# Patient Record
Sex: Male | Born: 1960 | Race: White | Hispanic: No | Marital: Married | Smoking: Never smoker
Health system: Southern US, Community
[De-identification: ages and names within clinical notes are randomized; demographics above are authoritative.]

## PROBLEM LIST (undated history)

## (undated) DIAGNOSIS — R519 Headache, unspecified: Secondary | ICD-10-CM

## (undated) DIAGNOSIS — F32A Depression, unspecified: Secondary | ICD-10-CM

## (undated) DIAGNOSIS — Z87442 Personal history of urinary calculi: Secondary | ICD-10-CM

## (undated) DIAGNOSIS — H04129 Dry eye syndrome of unspecified lacrimal gland: Secondary | ICD-10-CM

## (undated) DIAGNOSIS — M797 Fibromyalgia: Secondary | ICD-10-CM

## (undated) DIAGNOSIS — F419 Anxiety disorder, unspecified: Secondary | ICD-10-CM

## (undated) DIAGNOSIS — M1990 Unspecified osteoarthritis, unspecified site: Secondary | ICD-10-CM

## (undated) DIAGNOSIS — G4730 Sleep apnea, unspecified: Secondary | ICD-10-CM

## (undated) DIAGNOSIS — K219 Gastro-esophageal reflux disease without esophagitis: Secondary | ICD-10-CM

## (undated) DIAGNOSIS — R7303 Prediabetes: Secondary | ICD-10-CM

## (undated) HISTORY — PX: INGUINAL HERNIA REPAIR: SUR1180

## (undated) HISTORY — PX: HAMMER TOE SURGERY: SHX385

## (undated) HISTORY — PX: COLONOSCOPY: SHX174

## (undated) HISTORY — PX: CATARACT EXTRACTION: SUR2

## (undated) HISTORY — PX: CHOLECYSTECTOMY: SHX55

## (undated) HISTORY — PX: TONSILLECTOMY: SUR1361

## (undated) HISTORY — PX: KNEE ARTHROSCOPY: SUR90

---

## 1999-09-28 ENCOUNTER — Encounter: Payer: Self-pay | Admitting: Emergency Medicine

## 1999-09-28 ENCOUNTER — Emergency Department (HOSPITAL_COMMUNITY): Admission: EM | Admit: 1999-09-28 | Discharge: 1999-09-28 | Payer: Self-pay | Admitting: Emergency Medicine

## 2005-04-09 ENCOUNTER — Ambulatory Visit: Payer: Self-pay | Admitting: Internal Medicine

## 2005-04-09 ENCOUNTER — Ambulatory Visit (HOSPITAL_BASED_OUTPATIENT_CLINIC_OR_DEPARTMENT_OTHER): Admission: RE | Admit: 2005-04-09 | Discharge: 2005-04-09 | Payer: Self-pay | Admitting: Family Medicine

## 2005-04-10 ENCOUNTER — Encounter: Admission: RE | Admit: 2005-04-10 | Discharge: 2005-04-10 | Payer: Self-pay | Admitting: Gastroenterology

## 2005-04-10 IMAGING — CT CT PELVIS W/ CM
1 of 3 series · 14 of 32 positions shown, 19 images · IV contrast (READICAT/WATER & [ID] OMNI 300)
Comparison: None.

CLINICAL DATA: 44-year-old, with nausea, diarrhea, and bloating.  
 ABDOMEN CT WITH CONTRAST:
TECHNIQUE: Multidetector CT imaging of the abdomen was performed following the standard protocol during bolus administration of intravenous contrast.
 Contrast:  100 cc Omnipaque 300
TECHNIQUE: Multidetector CT imaging of the pelvis was performed following the standard protocol during bolus administration of intravenous contrast.

[Series 2: routine abdomen · axial · 0.70mm/px · z∈[-384,-30]mm · 14 of 81 slices shown, 19 images]
[im 5/81  soft-tissue]
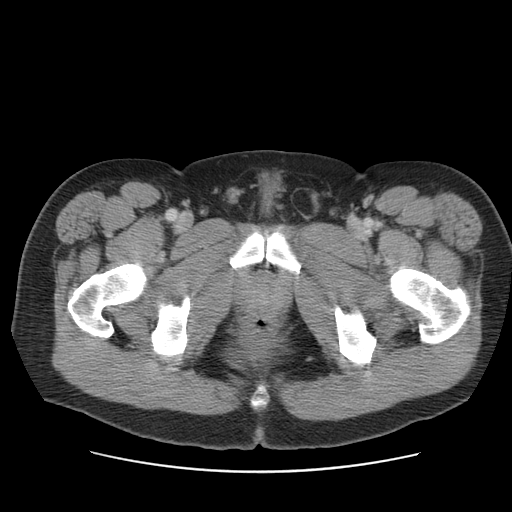
[im 5/81  bone]
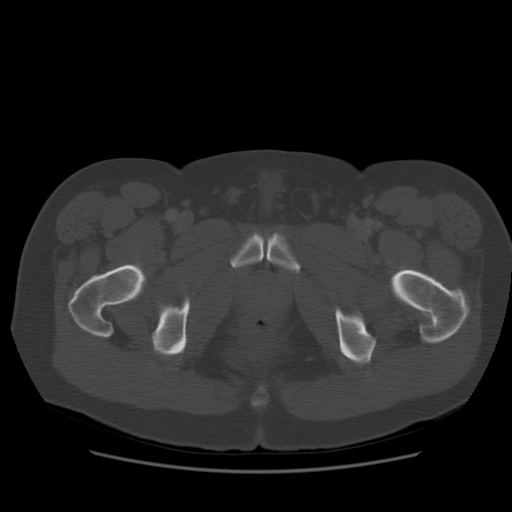
[im 13/81  soft-tissue]
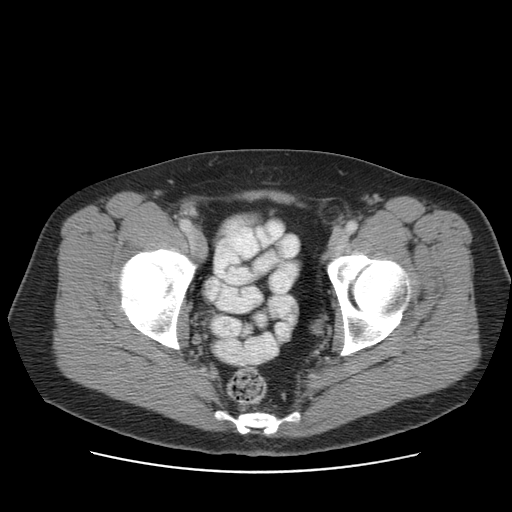
[im 17/81  soft-tissue]
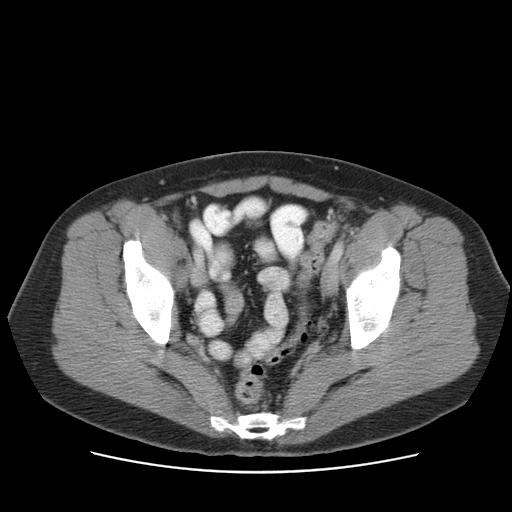
[im 22/81  soft-tissue]
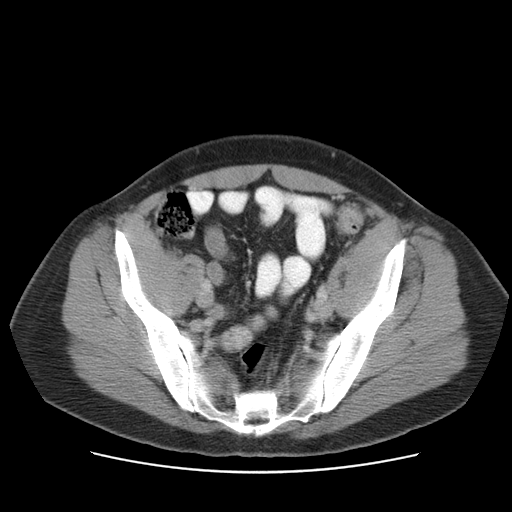
[im 30/81  soft-tissue]
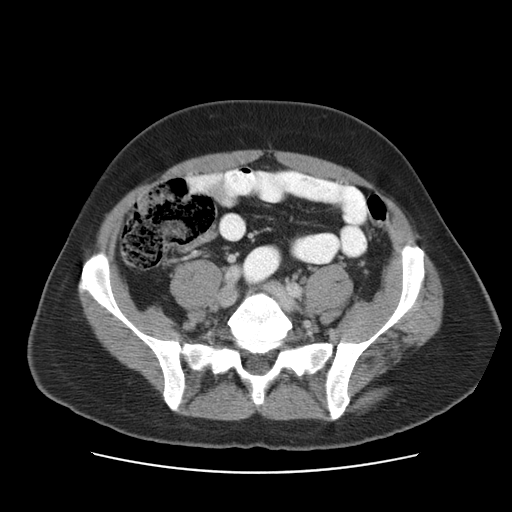
[im 34/81  soft-tissue]
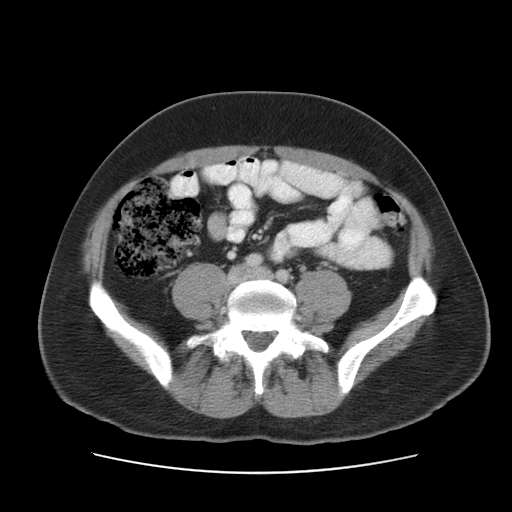
[im 43/81  soft-tissue]
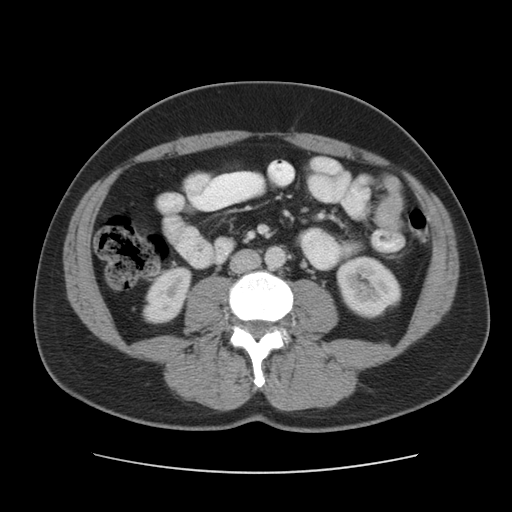
[im 47/81  soft-tissue]
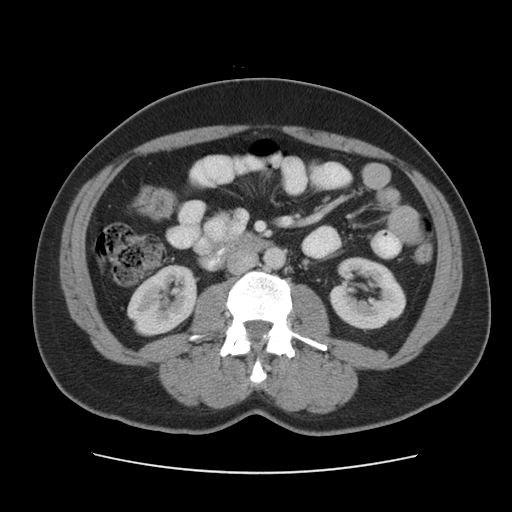
[im 51/81  soft-tissue]
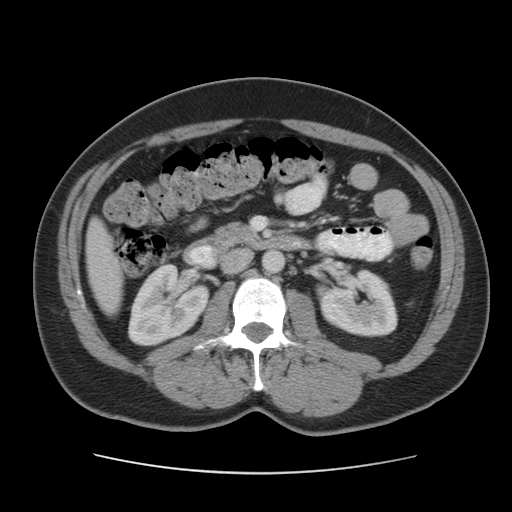
[im 51/81  bone]
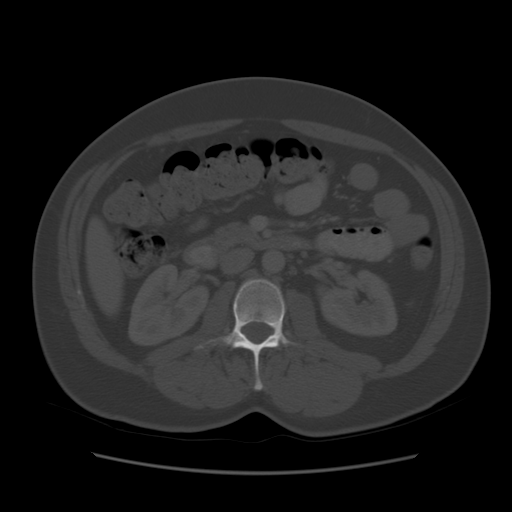
[im 59/81  soft-tissue]
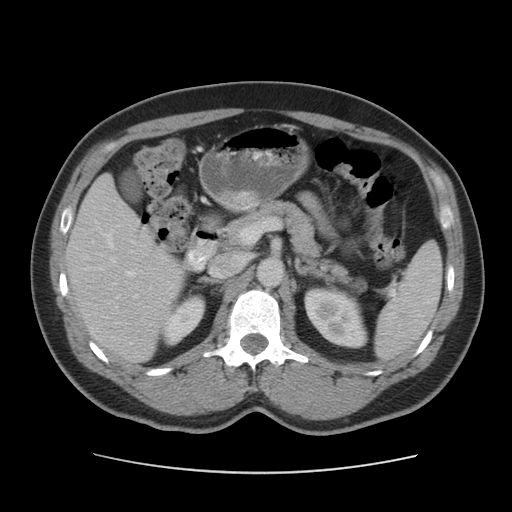
[im 64/81  soft-tissue]
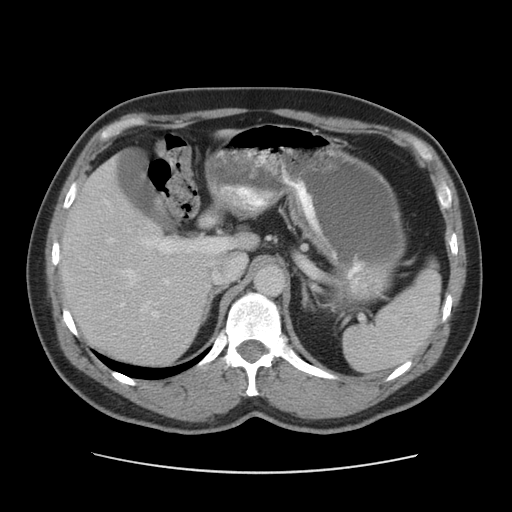
[im 64/81  lung]
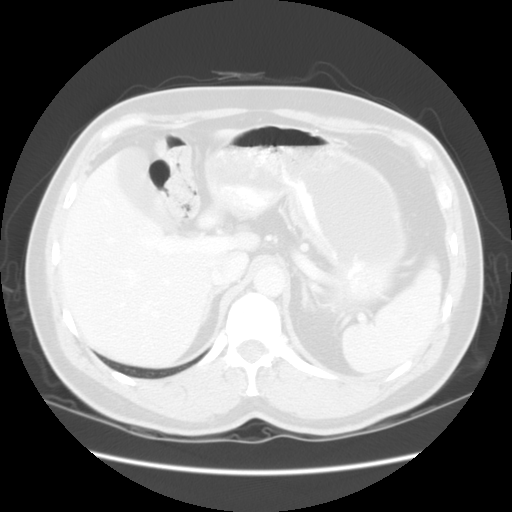
[im 68/81  soft-tissue]
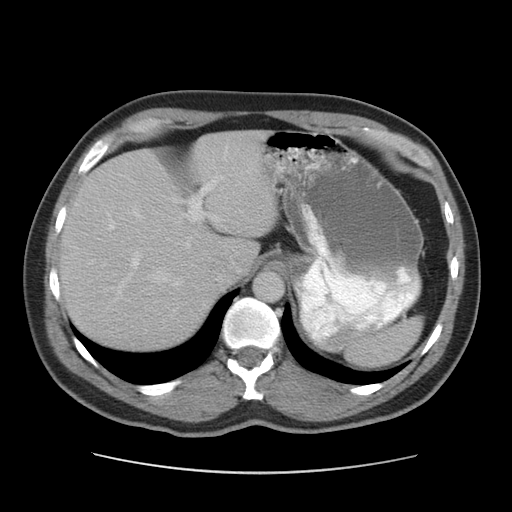
[im 68/81  lung]
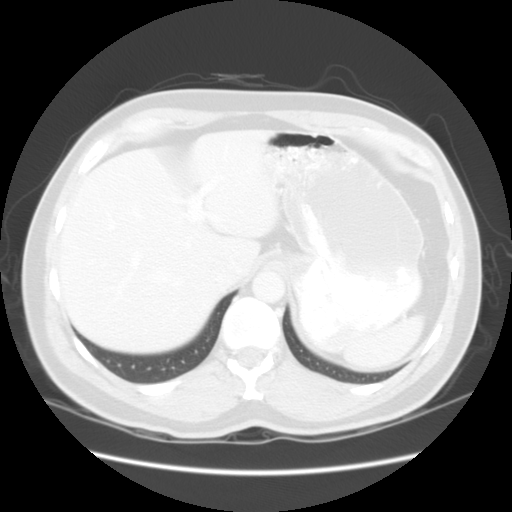
[im 72/81  lung]
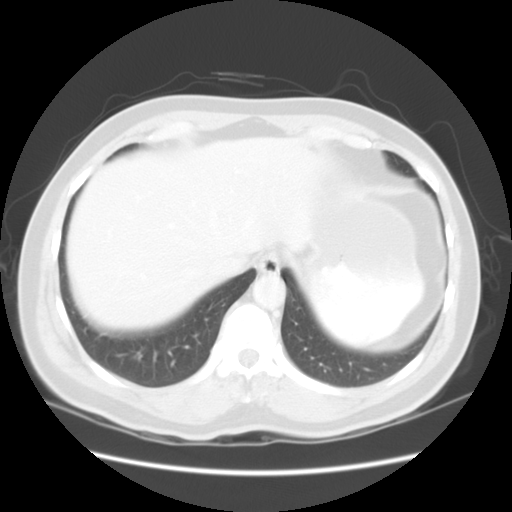
[im 76/81  soft-tissue]
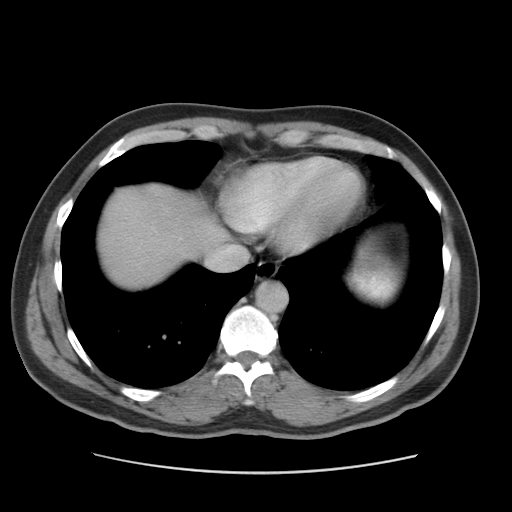
[im 76/81  lung]
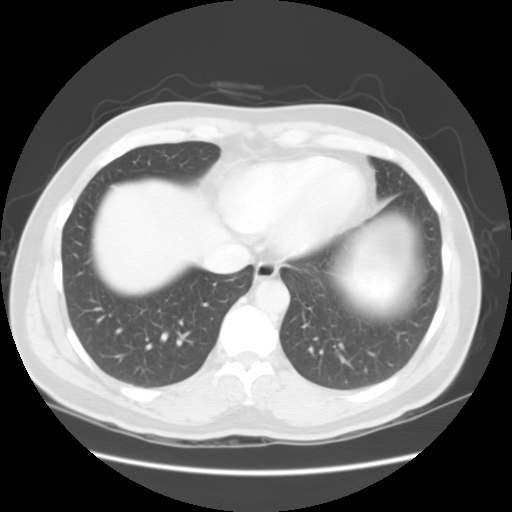

[14 of 32 positions shown; findings below may reference images not displayed]

FINDINGS: The lung bases are clear.  
 There is a small low attenuation lesion in the left lobe of the liver which measures approximately 23 Hounsfield units.  It may be a simple cyst or a small hemangioma.  The gallbladder appears normal.  The spleen is normal in size.  The pancreas, adrenal glands, and kidneys are unremarkable.  The stomach is well distended with contrast and water and demonstrates no significant findings.  The duodenum, small bowel, and colon are unremarkable.  The aorta is normal in caliber.  Major branch vessels are normal.  The portal and splenic veins are normal.  No mesenteric or retroperitoneal masses, or adenopathy.  No significant bony findings.
IMPRESSION: Single small low attenuation lesion of the liver likely benign cysts or hemangioma.  Remainder of the abdomen is unremarkable.  
 PELVIS CT WITH CONTRAST:
FINDINGS: The terminal ileum and cecum are unremarkable.  Rectum, sigmoid colon, and visualized small bowel loops are normal.  Bladder appears normal.  No pelvic masses, adenopathy, or free pelvic fluid collections.  Slightly enlarged left inguinal ring with fat and a small area of calcification but no bowel.  Bony structures are unremarkable.
IMPRESSION: 1.  Small left inguinal hernia. 
 2.  No other significant pelvic findings.

## 2005-04-24 ENCOUNTER — Encounter: Admission: RE | Admit: 2005-04-24 | Discharge: 2005-04-24 | Payer: Self-pay | Admitting: Family Medicine

## 2005-07-13 ENCOUNTER — Ambulatory Visit (HOSPITAL_COMMUNITY): Admission: RE | Admit: 2005-07-13 | Discharge: 2005-07-13 | Payer: Self-pay | Admitting: Orthopaedic Surgery

## 2006-01-31 ENCOUNTER — Encounter: Admission: RE | Admit: 2006-01-31 | Discharge: 2006-01-31 | Payer: Self-pay | Admitting: Gastroenterology

## 2006-11-22 ENCOUNTER — Ambulatory Visit (HOSPITAL_COMMUNITY): Admission: RE | Admit: 2006-11-22 | Discharge: 2006-11-22 | Payer: Self-pay | Admitting: Gastroenterology

## 2006-12-19 ENCOUNTER — Encounter (INDEPENDENT_AMBULATORY_CARE_PROVIDER_SITE_OTHER): Payer: Self-pay | Admitting: General Surgery

## 2006-12-19 ENCOUNTER — Ambulatory Visit (HOSPITAL_COMMUNITY): Admission: RE | Admit: 2006-12-19 | Discharge: 2006-12-20 | Payer: Self-pay | Admitting: General Surgery

## 2010-07-13 ENCOUNTER — Emergency Department (HOSPITAL_COMMUNITY)
Admission: EM | Admit: 2010-07-13 | Discharge: 2010-07-13 | Disposition: A | Discharge status: Home / Self Care | Payer: BC Managed Care – PPO | Attending: Emergency Medicine | Admitting: Emergency Medicine

## 2010-07-13 DIAGNOSIS — H81399 Other peripheral vertigo, unspecified ear: Secondary | ICD-10-CM | POA: Insufficient documentation

## 2010-07-13 DIAGNOSIS — R112 Nausea with vomiting, unspecified: Secondary | ICD-10-CM | POA: Insufficient documentation

## 2010-09-05 NOTE — Op Note (Signed)
NAMEGRIFFIN, Daniel Novak                  ACCOUNT NO.:  [id number]   MEDICAL RECORD NO.:  [id number]          PATIENT TYPE:  OIB   LOCATION:  1607                         FACILITY:  [facility]   PHYSICIAN:  Angelia Mould. Derrell Lolling, M.D.DATE OF BIRTH:  Sep 17, 1960   DATE OF PROCEDURE:  12/19/2006  DATE OF DISCHARGE:  12/20/2006                               OPERATIVE REPORT   PREOPERATIVE DIAGNOSIS:  Biliary dyskinesia.   POSTOPERATIVE DIAGNOSIS:  Biliary dyskinesia.   OPERATION PERFORMED:  Laparoscopic cholecystectomy with intraoperative  cholangiogram.   SURGEON:  Angelia Mould. Derrell Lolling, M.D.   OPERATIVE INDICATIONS:  This is a 50 year old white man who has a long  history of irritable bowel syndrome and lower abdominal crampy  discomfort with alternating diarrhea and constipation.  More recently he  has had a problem with epigastric pain and lower chest pain with  belching and nausea.  This occurs after meals.  He has had a CT scan,  which was unrevealing.  He has had an abdominal ultrasound and endoscopy  with Dr. Ewing Schlein recently, which was unrevealing.  A biliary scan  performed recently showed a decreased gallbladder ejection fraction of  16.2%.  The CT scan actually shows a small cyst or cavernous hemangioma  of his liver.  Another ultrasound was done back in October 2007 that  showed a small gallbladder polyp but no other abnormal findings.  I  agreed with Dr. Madilyn Fireman that he had a reasonable probability of having  chronic cholecystitis as the cause of his symptoms.  He was offered the  option of cholecystectomy at this time, and he wanted to do that.   OPERATIVE FINDINGS:  The gallbladder was thin-walled but was somewhat  discolored and had a little bit of hemorrhage in the wall up near the  fundus that was not explained.  This looked like petechiae.  There was  no active bleeding.  The liver looked healthy.  The stomach, duodenum,  small intestine and large intestine and peritoneal surfaces were  normal  to inspection.  The cholangiogram was normal, showing normal  intrahepatic and extrahepatic bile ducts, no filling defect, and no  obstruction with good flow of contrast into the duodenum.  The cystic  duct was very tiny.  The caliber of common bile duct was also very tiny.   OPERATIVE TECHNIQUE:  Following the induction of general endotracheal  anesthesia, the patient's abdomen was prepped and draped in a sterile  fashion.  The patient was identified as to correct patient and correct  procedure and intravenous antibiotics were given prior to the incision.  Marcaine 0.5% with epinephrine was used as a local infiltration  anesthetic.  A vertically-oriented incision was made at the lower rim of  the umbilicus.  The fascia was incised in the midline and the abdominal  cavity entered under direct vision.  A 10-mm Hasson trocar was inserted  and secured with a pursestring suture of 0 Vicryl.  Pneumoperitoneum was  created.  The video camera was inserted with visualization and findings  as described above.  A 10-mm trocar was placed in the subxiphoid  region  and two 5-mm trocars placed in the right upper quadrant.  The  gallbladder fundus was identified and grasped and elevated.  The  infundibulum was retracted laterally. I dissected out the cystic duct  and cystic artery.  The cystic artery was isolated as it went onto the  wall of the gallbladder, secured with metal clips and divided.  This  created a large window behind the cystic duct.  A cholangiogram catheter  was inserted into the cystic duct and a cholangiogram was obtained using  the C-arm.  The cholangiogram was normal as described above.  The  cholangiogram catheter was removed, the cystic duct secured with three  metal clips and divided.  The gallbladder was dissected from its bed  with electrocautery, placed in the specimen bag and removed.  The  operative field was copiously irrigated with saline.  At te completion  of  the case there was no bleeding and no bile leak whatsoever and the  irrigation fluid was completely clear.  The trocars were removed under  direct vision.  There was no bleeding from the trocar sites.  The  pneumoperitoneum was released.  The fascia at the umbilicus was closed  with 0 Vicryl sutures.  The skin incisions were closed with subcuticular  sutures of 4-0 Monocryl and Steri-Strips.  Clean bandages were placed  and the patient taken to the recovery room in stable condition.  Estimated blood loss was about 10 mL.  Complications:  None. Sponge,  needle and instrument counts were correct.      Angelia Mould. Derrell Lolling, M.D.  Electronically Signed     HMI/MEDQ  D:  12/19/2006  T:  12/20/2006  Job:  [id number]   cc:   Everardo All. Madilyn Fireman, M.D.  Fax: [phone]   Dario Guardian, M.D.  Fax: [phone]

## 2010-09-08 NOTE — Procedures (Signed)
Daniel Novak, Daniel Novak                  ACCOUNT NO.:  [id number]   MEDICAL RECORD NO.:  [id number]          PATIENT TYPE:  OUT   LOCATION:  SLEEP CENTER                 FACILITY:  [facility]   PHYSICIAN:  Clinton D. Maple Hudson, M.D. DATE OF BIRTH:  January 18, 1961   DATE OF STUDY:  04/09/2005                              NOCTURNAL POLYSOMNOGRAM   REFERRING PHYSICIAN:  Dario Guardian, M.D.   DATE OF STUDY:  April 09, 2005.   INDICATION FOR STUDY:  Hypersomnia with sleep apnea.   EPWORTH SLEEPINESS SCORE:  20/24.   BMI:  24.   WEIGHT:  158 pounds.   MEDICATIONS:  Prilosec, Cymbalta.   SLEEP ARCHITECTURE:  Total sleep time 419 minutes with sleep efficiency 76%.  Stage I was 17%, stage II 67%, stages III and IV absent, REM 16% of total  sleep time. Sleep latency 22 minutes, REM latency 111 minutes, awake after  sleep onset 109 minutes, arousal index 27. No bedtime medication taken.   RESPIRATORY DATA:  Split study protocol. Apnea hypopnea index (AHI, RDI)  27.1 obstructive events per hour indicating moderate obstructive sleep  apnea/hypopnea syndrome. There was 36 obstructive apneas, 1 mixed apnea and  29 hypopneas before C-PAP. Most sleep and most events were recorded while  supine but significant events were also noted while on left side. REM AHI  4.4 per hour. C-PAP was titrated to 11 CWP, AHI 1.2 per hour. A ResMed Swift  with medium nasal pillows was used, with a heated humidifier. He had some  difficulty adjusting to the appliance and monitor at his nose.   OXYGEN DATA:  Moderately loud snoring with oxygen desaturation to a nadir of  89% before C-PAP. After C-PAP control, saturation held 96% on room air.   CARDIAC DATA:  Sinus rhythm with occasional PVC.   MOVEMENT/PARASOMNIAS:  Occasional leg jerk with little effect on sleep.   IMPRESSION/RECOMMENDATIONS:  1.  Moderate obstructive sleep apnea/hypopnea syndrome, AHI 27.1 per hour      with moderately loud snoring and oxygen  desaturation to 89%. Events were      noted while supine and on left side.  2.  Successful C-PAP titration to 11 CWP, AHI 1.2 per hour. A ResMed Swift      was used with medium nasal pillows and heated humidifier.      Clinton D. Maple Hudson, M.D.  Diplomate, Biomedical engineer of Sleep Medicine  Electronically Signed     CDY/MEDQ  D:  04/16/2005 12:45:54  T:  04/16/2005 19:35:05  Job:  [id number]

## 2010-09-08 NOTE — Op Note (Signed)
NAMEKNIGHT, OELKERS                  ACCOUNT NO.:  [id number]   MEDICAL RECORD NO.:  [id number]          PATIENT TYPE:  AMB   LOCATION:  SDS                          FACILITY:  [facility]   PHYSICIAN:  Vanita Panda. Magnus Ivan, M.D.DATE OF BIRTH:  1960-08-25   DATE OF PROCEDURE:  07/13/2005  DATE OF DISCHARGE:  07/13/2005                                 OPERATIVE REPORT   PREOPERATIVE DIAGNOSIS:  Right knee internal derangement with questionable  anterior articular surface lateral meniscal tear.   POSTOPERATIVE DIAGNOSES:  1.  Right knee articular surface anterior lateral meniscal tear.  2.  Grade III chondromalacia medial femoral condyle.   PROCEDURE:  Right knee arthroscopy with debridement including shaving of  articular cartilage medial femoral condyle and partial lateral meniscectomy.   SURGEON:  Vanita Panda. Magnus Ivan, M.D.   ANESTHESIA:  General.   BLOOD LOSS:  Minimal.   COMPLICATIONS:  None.   TOURNIQUET TIME:  30 minutes.   INDICATIONS:  Briefly, Mr. Genet is a 50 year old male with a long history of  right knee pain.  He was seen by primary care sports medicine physician who  had tried anti-inflammatories as well as injections of the knee as well as  physical therapy.  After these modalities failed, he obtained an MRI which  did show questionable articular surface tearing of the anterior horn of the  lateral meniscus on the right knee.  Due to his continued pain, he wanted to  proceed with surgical option and we proceeded to the operating room.  The  risks, benefits of this were explained and well understood.   DESCRIPTION OF PROCEDURE:  After informed consent was obtained, the proper  right leg was marked, Mr. Sanders was brought to the operating room, placed  supine on the operating table.  General anesthesia was then obtained, a  nonsterile tourniquet was placed around his upper thigh and his leg was  prepped and draped with DuraPrep and sterile drapes including a  sterile  stockinette.  A lateral leg post was used and the bed was raised and the leg  was flexed off of the side of the table.  An anterior lateral portal was  made at the level of the inferior pole of patella, just lateral to the  patellar tendon.  An arthroscope was inserted, immediately you could see the  medial femoral condyle had grade III chondromalacia with some fragmented  edges at the articular aspect of this.  A medial portal was then made at the  level of the inferior pole of patella just medial to patellar tendon.  An  arthroscopic shaver was inserted, debridement was carried out including  shaving of the articular cartilage on the medial femoral condyle.  I then  inserted probe back in the leg and the medial meniscus was probed and found  to be intact.  The patellofemoral joint was assessed and found to be without  any degenerative changes.  The ACL and PCL were probed and found to be  intact and then the lateral part was assessed.  There was significant  fraying along the anterior  horn of the lateral meniscus and partial tearing  in this area.  With the arthroscopic shaver, I performed a debridement and  used to probe to make sure that there was no loose fragments over any part  of the meniscus that was subluxatable into the joint.  Once the debridement  was carried out, all instrumentation was removed and a cannula was inserted  to remove fluid from the knee.  I then closed the arthroscopy portal sites  interrupted 4-0 nylon suture with a single suture in each arthroscopy site.  The knee was infiltrated with a morphine and Marcaine mixture.  This was a  total of 10 ml of 0.25% Marcaine and 4 mg of morphine.  Well padded sterile  dressings were applied.  Of note, the tourniquet was raised during the case  and was let down at 30 minutes and the toes did pinken nicely.  Well-padded  sterile dressing was placed on the knee and the patient was awakened,  extubated and taken to  recovery room in stable condition.  All counts were  correct and there no, again, complications.  Blood loss was minimal.           ______________________________  Vanita Panda. Magnus Ivan, M.D.     CYB/MEDQ  D:  07/13/2005  T:  07/16/2005  Job:  [id number]

## 2011-02-02 LAB — CBC
MCV: 88.5
Platelets: 219
RDW: 14

## 2011-02-02 LAB — URINALYSIS, ROUTINE W REFLEX MICROSCOPIC
Bilirubin Urine: NEGATIVE
Glucose, UA: NEGATIVE
Hgb urine dipstick: NEGATIVE
Ketones, ur: NEGATIVE
Nitrite: NEGATIVE
Protein, ur: NEGATIVE

## 2011-02-02 LAB — DIFFERENTIAL
Basophils Absolute: 0
Eosinophils Relative: 2
Lymphs Abs: 1.5
Monocytes Relative: 12 — ABNORMAL HIGH

## 2011-02-02 LAB — COMPREHENSIVE METABOLIC PANEL
ALT: 25
Albumin: 3.7
Alkaline Phosphatase: 68
Calcium: 9.3
GFR calc Af Amer: >60
Potassium: 4.5
Sodium: 142
Total Protein: 6.5

## 2012-02-18 ENCOUNTER — Other Ambulatory Visit: Payer: Self-pay | Admitting: Family Medicine

## 2012-02-18 ENCOUNTER — Ambulatory Visit
Admission: RE | Admit: 2012-02-18 | Discharge: 2012-02-18 | Disposition: A | Discharge status: Home / Self Care | Payer: BC Managed Care – PPO | Source: Ambulatory Visit | Attending: Family Medicine | Admitting: Family Medicine

## 2012-02-18 DIAGNOSIS — R109 Unspecified abdominal pain: Secondary | ICD-10-CM

## 2012-02-18 MED ORDER — IOHEXOL 300 MG/ML  SOLN
100.0000 mL | Freq: Once | INTRAMUSCULAR | Status: AC | PRN
  Administered 2012-02-18: 100 mL via INTRAVENOUS

## 2012-07-03 ENCOUNTER — Other Ambulatory Visit: Payer: Self-pay | Admitting: Otolaryngology

## 2012-07-11 ENCOUNTER — Ambulatory Visit
Admission: RE | Admit: 2012-07-11 | Discharge: 2012-07-11 | Disposition: A | Discharge status: Home / Self Care | Payer: BC Managed Care – PPO | Source: Ambulatory Visit | Attending: Otolaryngology | Admitting: Otolaryngology

## 2012-07-11 DIAGNOSIS — G43109 Migraine with aura, not intractable, without status migrainosus: Secondary | ICD-10-CM

## 2012-07-11 DIAGNOSIS — H9319 Tinnitus, unspecified ear: Secondary | ICD-10-CM

## 2012-07-11 DIAGNOSIS — R42 Dizziness and giddiness: Secondary | ICD-10-CM

## 2012-07-11 MED ORDER — GADOBENATE DIMEGLUMINE 529 MG/ML IV SOLN
15.0000 mL | Freq: Once | INTRAVENOUS | Status: AC | PRN
  Administered 2012-07-11: 15 mL via INTRAVENOUS

## 2013-07-27 ENCOUNTER — Other Ambulatory Visit: Payer: Self-pay | Admitting: Gastroenterology

## 2013-07-27 DIAGNOSIS — R109 Unspecified abdominal pain: Secondary | ICD-10-CM

## 2013-07-29 ENCOUNTER — Ambulatory Visit
Admission: RE | Admit: 2013-07-29 | Discharge: 2013-07-29 | Disposition: A | Discharge status: Home / Self Care | Payer: BC Managed Care – PPO | Source: Ambulatory Visit | Attending: Gastroenterology | Admitting: Gastroenterology

## 2013-07-29 DIAGNOSIS — R109 Unspecified abdominal pain: Secondary | ICD-10-CM

## 2013-07-29 MED ORDER — IOHEXOL 300 MG/ML  SOLN
100.0000 mL | Freq: Once | INTRAMUSCULAR | Status: AC | PRN
  Administered 2013-07-29: 100 mL via INTRAVENOUS

## 2016-02-03 ENCOUNTER — Other Ambulatory Visit: Payer: Self-pay | Admitting: Gastroenterology

## 2016-02-03 DIAGNOSIS — K589 Irritable bowel syndrome without diarrhea: Secondary | ICD-10-CM

## 2016-02-03 DIAGNOSIS — R1084 Generalized abdominal pain: Secondary | ICD-10-CM

## 2016-02-10 ENCOUNTER — Ambulatory Visit
Admission: RE | Admit: 2016-02-10 | Discharge: 2016-02-10 | Disposition: A | Discharge status: Home / Self Care | Payer: BC Managed Care – PPO | Source: Ambulatory Visit | Attending: Gastroenterology | Admitting: Gastroenterology

## 2016-02-10 DIAGNOSIS — R1084 Generalized abdominal pain: Secondary | ICD-10-CM

## 2016-02-10 DIAGNOSIS — K589 Irritable bowel syndrome without diarrhea: Secondary | ICD-10-CM

## 2016-02-10 MED ORDER — IOPAMIDOL (ISOVUE-300) INJECTION 61%
100.0000 mL | Freq: Once | INTRAVENOUS | Status: AC | PRN
  Administered 2016-02-10: 100 mL via INTRAVENOUS

## 2018-02-05 DIAGNOSIS — M5030 Other cervical disc degeneration, unspecified cervical region: Secondary | ICD-10-CM | POA: Insufficient documentation

## 2018-02-05 DIAGNOSIS — M542 Cervicalgia: Secondary | ICD-10-CM | POA: Insufficient documentation

## 2018-09-02 DIAGNOSIS — M47812 Spondylosis without myelopathy or radiculopathy, cervical region: Secondary | ICD-10-CM | POA: Insufficient documentation

## 2018-09-25 ENCOUNTER — Other Ambulatory Visit: Payer: Self-pay | Admitting: Neurological Surgery

## 2018-09-25 DIAGNOSIS — M542 Cervicalgia: Secondary | ICD-10-CM

## 2018-10-06 ENCOUNTER — Ambulatory Visit
Admission: RE | Admit: 2018-10-06 | Discharge: 2018-10-06 | Disposition: A | Discharge status: Home / Self Care | Payer: BC Managed Care – PPO | Source: Ambulatory Visit | Attending: Neurological Surgery | Admitting: Neurological Surgery

## 2018-10-06 ENCOUNTER — Other Ambulatory Visit: Payer: Self-pay

## 2018-10-06 DIAGNOSIS — M542 Cervicalgia: Secondary | ICD-10-CM

## 2019-04-07 ENCOUNTER — Encounter: Payer: Self-pay | Admitting: Family Medicine

## 2019-04-07 ENCOUNTER — Ambulatory Visit: Payer: Self-pay

## 2019-04-07 ENCOUNTER — Ambulatory Visit: Payer: BC Managed Care – PPO | Admitting: Family Medicine

## 2019-04-07 ENCOUNTER — Other Ambulatory Visit: Payer: Self-pay

## 2019-04-07 DIAGNOSIS — M25562 Pain in left knee: Secondary | ICD-10-CM | POA: Diagnosis not present

## 2019-04-07 DIAGNOSIS — G8929 Other chronic pain: Secondary | ICD-10-CM | POA: Diagnosis not present

## 2019-04-07 DIAGNOSIS — M25561 Pain in right knee: Secondary | ICD-10-CM | POA: Diagnosis not present

## 2019-04-07 MED ORDER — GLUCOSAMINE SULFATE 1000 MG PO CAPS: 1.0000 | ORAL_CAPSULE | Freq: Two times a day (BID) | ORAL | 3 refills | Status: DC

## 2019-04-07 MED ORDER — VITAMIN D-3 125 MCG (5000 UT) PO TABS: 1.0000 | ORAL_TABLET | Freq: Every day | ORAL | 3 refills | Status: DC

## 2019-04-07 MED ORDER — DICLOFENAC SODIUM 1 % EX GEL: 4.0000 g | Freq: Four times a day (QID) | CUTANEOUS | 6 refills | Status: DC | PRN

## 2019-04-07 NOTE — Progress Notes (Signed)
I saw and examined the patient with Dr. Mayer Masker and agree with assessment and plan as outlined.    Chronic bilateral anterior knee pain.  Exam unremarkable, x-rays look good.  Will try PT, voltaren gel.  Return as needed.

## 2019-04-07 NOTE — Progress Notes (Signed)
   Daniel Novak - 58 y.o. male MRN [id number]  Date of birth: 16-Sep-1960  Office Visit Note: Visit Date: 04/07/2019 PCP: Carol Ada, MD Referred by: Carol Ada, MD  Subjective: Chief Complaint  Patient presents with  . Right Knee - Pain    Chronic knee pain. H/o arthroscopy on the right by Dr. Ninfa Linden approximately 15 years ago. Sees a rheumatologist (Dr. Annamaria Boots) for fibromyalgia/CMT and Sjogren's. "I have pain all over."  . Left Knee - Pain   HPI: Daniel Novak is a 58 y.o. male who comes in today with chronic bilateral knee pain, right worse than left. Reports constant dull pain in anterior knee. No mechanical symptoms, no swelling. Hurts all day, also when going to bed. Has tried injections in the past with no improvement. Has tried various topical medicines. Has been taking glucosamine with minimal relief.  Had arthroscopic surgery for meniscal injury in 2007 with Dr. Ninfa Linden.     ROS Otherwise per HPI.  Assessment & Plan: Visit Diagnoses:  1. Chronic pain of right knee   2. Chronic pain of left knee     Plan: History of bilateral knee pain with unremarkable exam, normal appearing x-rays. Will trial PT to work on improving quad strength and also suggested voltaren gel.   Meds & Orders: No orders of the defined types were placed in this encounter.   Orders Placed This Encounter  Procedures  . XR Knee 1-2 Views Right  . XR Knee 1-2 Views Left    Follow-up: No follow-ups on file.   Procedures: No procedures performed  No notes on file   Clinical History: No specialty comments available.   He has no history on file for tobacco. No results for input(s): HGBA1C, LABURIC in the last 8760 hours.  Objective:  VS:  HT:    WT:   BMI:     BP:   HR: bpm  TEMP: ( )  RESP:  Physical Exam  PHYSICAL EXAM: Gen: NAD, alert, cooperative with exam, well-appearing HEENT: clear conjunctiva,  CV:  no edema, capillary refill brisk, normal rate Resp: non-labored Skin: no  rashes, normal turgor  Neuro: no gross deficits.  Psych:  alert and oriented  Ortho Exam  Left knee: - Inspection: no gross deformity. No swelling/effusion, erythema or bruising. Skin intact - Palpation: no TTP - ROM: full active ROM with flexion and extension in knee and hip - Strength: 5/5 strength - Neuro/vasc: NV intact - Special Tests: - LIGAMENTS: negative anterior and posterior drawer, negative Lachman's, no MCL or LCL laxity  -- MENISCUS: negative McMurray's, negative Thessaly  -- PF JOINT: nml patellar mobility bilaterally.  negative patellar grind  Right knee:      Hips: normal ROM, negative FABER and FADIR bilaterally Imaging: No results found.  Past Medical/Family/Surgical/Social History: Medications & Allergies reviewed per EMR, new medications updated. There are no problems to display for this patient.  No past medical history on file. No family history on file.  Social History   Occupational History  . Not on file  Tobacco Use  . Smoking status: Not on file  Substance and Sexual Activity  . Alcohol use: Not on file  . Drug use: Not on file  . Sexual activity: Not on file

## 2019-04-10 DIAGNOSIS — G600 Hereditary motor and sensory neuropathy: Secondary | ICD-10-CM | POA: Insufficient documentation

## 2019-05-04 ENCOUNTER — Other Ambulatory Visit: Payer: Self-pay

## 2019-05-04 ENCOUNTER — Encounter: Payer: Self-pay | Admitting: Physical Therapy

## 2019-05-04 ENCOUNTER — Ambulatory Visit: Payer: BC Managed Care – PPO | Admitting: Physical Therapy

## 2019-05-04 DIAGNOSIS — R2689 Other abnormalities of gait and mobility: Secondary | ICD-10-CM

## 2019-05-04 DIAGNOSIS — M25561 Pain in right knee: Secondary | ICD-10-CM

## 2019-05-04 DIAGNOSIS — M25562 Pain in left knee: Secondary | ICD-10-CM | POA: Diagnosis not present

## 2019-05-04 DIAGNOSIS — G8929 Other chronic pain: Secondary | ICD-10-CM

## 2019-05-04 DIAGNOSIS — M6281 Muscle weakness (generalized): Secondary | ICD-10-CM

## 2019-05-04 NOTE — Therapy (Signed)
[facility] Physical Therapy [address] [place], [place], [address] Phone: [phone]   Fax:  [phone]  Physical Therapy Evaluation  Patient Details  Name: Daniel Novak MRN: [id number] Date of Birth: 23-Nov-1960 Referring Provider (PT): Lavada Mesi, MD   Encounter Date: 05/04/2019  PT End of Session - 05/04/19 1513    Visit Number  1    Number of Visits  12    Date for PT Re-Evaluation  06/15/19    Authorization Type  [employer]    PT Start Time  1400    PT Stop Time  1445    PT Time Calculation (min)  45 min    Activity Tolerance  Patient tolerated treatment well    Behavior During Therapy  [facility] for tasks assessed/performed       History reviewed. No pertinent past medical history.  History reviewed. No pertinent surgical history.  There were no vitals filed for this visit.   Subjective Assessment - 05/04/19 1320    Subjective  Chronic bilat knee pain for 15 years. H/o arthroscopy on the right by Dr. Magnus Ivan approximately 15 years ago. Sees a rheumatologist (Dr. Maple Hudson) for fibromyalgia/CMT and Sjogren's.    Pertinent History  CMT, fibromyalgia    Limitations  Lifting;Standing;Walking    How long can you stand comfortably?  15 min    How long can you walk comfortably?  15 min    Diagnostic tests  XR are remarkable    Patient Stated Goals  get rid of some of the pain    Currently in Pain?  Yes    Pain Score  6     Pain Location  Knee    Pain Orientation  Right;Left;Anterior    Pain Descriptors / Indicators  Aching    Pain Type  Chronic pain    Pain Radiating Towards  denies    Pain Onset  More than a month ago    Pain Frequency  Constant    Aggravating Factors   doing too much activity    Pain Relieving Factors  stretching, pain cream         [facility] PT Assessment - 05/04/19 0001      Assessment   Medical Diagnosis  Chronic bilat knee pain    Referring Provider (PT)  Hilts, Michael, MD    Onset Date/Surgical Date  --   15 year onset of  pain   Next MD Visit  nothing scheduled    Prior Therapy  none      Precautions   Precautions  None      Balance Screen   Has the patient fallen in the past 6 months  No      Home Environment   Living Environment  Private residence    Additional Comments  no steps or stairs      Prior Function   Level of Independence  Independent    Vocation  --   trying to get disability   Leisure  video games      Cognition   Overall Cognitive Status  Within Functional Limits for tasks assessed      Sensation   Light Touch  Appears Intact      ROM / Strength   AROM / PROM / Strength  AROM;Strength      AROM   AROM Assessment Site  Knee    Right/Left Knee  Right;Left    Right Knee Extension  0    Right Knee Flexion  0  Left Knee Extension  115    Left Knee Flexion  105      Strength   Overall Strength Comments  hip and knee strength 5-/5 MMT grossly tested in sitting      Flexibility   Soft Tissue Assessment /Muscle Length  --   tight hamstings and quads bilat     Palpation   Patella mobility  good mobility but lateral tracking in bilat knees    Palpation comment  TTP medial and anterior knee      Special Tests   Other special tests  neg ligamentous testing      Transfers   Transfers  Independent with all Transfers      Ambulation/Gait   Gait Comments  very mild antalgic slower gait                Objective measurements completed on examination: See above findings.      [facility] Adult PT Treatment/Exercise - 05/04/19 0001      Modalities   Modalities  Moist Heat;Electrical Stimulation      Moist Heat Therapy   Number Minutes Moist Heat  15 Minutes    Moist Heat Location  Knee   bilat     Electrical Stimulation   Electrical Stimulation Location  bilat knees    Electrical Stimulation Action  IFC    Electrical Stimulation Parameters  tolerance in sitting    Electrical Stimulation Goals  Pain             PT Education - 05/04/19 1513     Education Details  HEP, POC, TENS    Person(s) Educated  Patient    Methods  Explanation;Demonstration;Verbal cues    Comprehension  Verbalized understanding;Need further instruction          PT Long Term Goals - 05/04/19 1521      PT LONG TERM GOAL #1   Title  Pt will be I and compliant with HEP. (target for all goals 6 weeks 06/14/19    Status  New      PT LONG TERM GOAL #2   Title  Pt will improve hip/knee strength to 5/5 MMT tested in sitting to show improved strength for functional abilities.    Status  New      PT LONG TERM GOAL #3   Title  Pt will decrease overall pain to less than 3/10 with ususal activity.    Status  New             Plan - 05/04/19 1514    Clinical Impression Statement  Pt presents with Chronic Bilat knee pain. Special testing negative for ligamentous instability. She has overall decreased bilateral leg strength, latreral patella tracking, increased tightness in quads and H.S, and decreased activity tolerance for standing and walking. He will benefit from skilled PT to address his deficits.    Personal Factors and Comorbidities  Time since onset of injury/illness/exacerbation    Examination-Activity Limitations  Squat;Stairs;Stand;Lift;Locomotion Level    Examination-Participation Restrictions  Cleaning;Community Activity;Laundry;Yard Work    [employer]  Evolving/Moderate complexity    Clinical Decision Making  Moderate    Rehab Potential  Good    PT Frequency  2x / week   1-2   PT Duration  6 weeks    PT Treatment/Interventions  Aquatic Therapy;Cryotherapy;Electrical Stimulation;Iontophoresis 4mg /ml Dexamethasone;Moist Heat;Ultrasound;Stair training;Functional mobility training;Therapeutic activities;Therapeutic exercise;Balance training;Neuromuscular re-education;Gait training;Manual techniques;Passive range of motion;Dry needling;Joint Manipulations;Taping;Vasopneumatic Device    PT Next Visit Plan  Review and update  HEP  PRN, needs functional strength and knee/hip stretching    PT Home Exercise Plan  Access Code: [email or web address]       Patient will benefit from skilled therapeutic intervention in order to improve the following deficits and impairments:  Decreased activity tolerance, Decreased endurance, Decreased mobility, Decreased strength, Decreased range of motion, Difficulty walking, Impaired flexibility, Increased fascial restricitons, Pain  Visit Diagnosis: Chronic pain of right knee  Chronic pain of left knee  Muscle weakness (generalized)  Other abnormalities of gait and mobility     Problem List Patient Active Problem List   Diagnosis Date Noted  . 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 Cervical spondylosis 09/02/2018  . Chronic neck pain 02/05/2018  . DDD (degenerative disc disease), cervical 02/05/2018  . EBV infection 09/24/2011  . Diverticulosis 02/09/2011    Birdie Riddle 05/04/2019, 3:24 PM  [facility] Physical Therapy [address] [place], [place], [address] Phone: [phone]   Fax:  [phone]  Name: JAMONI HEWES MRN: [id number] Date of Birth: 1960-12-20

## 2019-05-04 NOTE — Patient Instructions (Signed)
Access Code: [email or web address]  URL: [email or web address]  Date: 05/04/2019  Prepared by: Ivery Quale   Exercises  Supine Heel Slide with Strap - 10 reps - 2 sets - 5 hold - 2x daily - 6x weekly  Supine Hamstring Stretch with Strap - 3 reps - 1 sets - 30 hold - 2x daily - 6x weekly  Supine Quadriceps Stretch with Strap on Table - 2 sets - 30 hold - 2x daily - 6x weekly  Supine Hip Adduction Isometric with Ball - 10 reps - 1-2 sets - 5 sec hold - 2x daily - 6x weekly  Supine Bridge with Mini Swiss Ball Between Knees - 10 reps - 1-2 sets - 5 sec hold - 2x daily - 6x weekly  Sit to Stand - 10 reps - 1-2 sets - 2x daily - 6x weekly  Seated Long Arc Quad with Hip Adduction - 10 reps - 1-2 sets - 3 sec hold - 2x daily - 6x weekly  Seated Cervical Retraction - 10 reps - 2-3 sets - 2x daily - 6x weekly  Seated Cervical Sidebending Stretch - 2-3 reps - 1 sets - 30 sec hold - 2x daily - 6x weekly  Patient Education  TENS Therapy

## 2019-06-02 ENCOUNTER — Other Ambulatory Visit: Payer: Self-pay | Admitting: Family Medicine

## 2019-06-02 DIAGNOSIS — G4452 New daily persistent headache (NDPH): Secondary | ICD-10-CM

## 2019-06-30 ENCOUNTER — Ambulatory Visit
Admission: RE | Admit: 2019-06-30 | Discharge: 2019-06-30 | Disposition: A | Discharge status: Home / Self Care | Payer: BC Managed Care – PPO | Source: Ambulatory Visit | Attending: Family Medicine | Admitting: Family Medicine

## 2019-06-30 ENCOUNTER — Other Ambulatory Visit: Payer: Self-pay

## 2019-06-30 DIAGNOSIS — G4452 New daily persistent headache (NDPH): Secondary | ICD-10-CM

## 2019-06-30 MED ORDER — GADOBENATE DIMEGLUMINE 529 MG/ML IV SOLN
14.0000 mL | Freq: Once | INTRAVENOUS | Status: AC | PRN
  Administered 2019-06-30: 14 mL via INTRAVENOUS

## 2020-10-07 ENCOUNTER — Other Ambulatory Visit: Payer: Self-pay | Admitting: Gastroenterology

## 2020-10-07 DIAGNOSIS — R110 Nausea: Secondary | ICD-10-CM

## 2020-10-19 ENCOUNTER — Ambulatory Visit (HOSPITAL_COMMUNITY)
Admission: RE | Admit: 2020-10-19 | Discharge: 2020-10-19 | Disposition: A | Discharge status: Home / Self Care | Payer: BC Managed Care – PPO | Source: Ambulatory Visit | Attending: Gastroenterology | Admitting: Gastroenterology

## 2020-10-19 ENCOUNTER — Other Ambulatory Visit: Payer: Self-pay

## 2020-10-19 DIAGNOSIS — R110 Nausea: Secondary | ICD-10-CM | POA: Insufficient documentation

## 2020-10-19 MED ORDER — TECHNETIUM TC 99M SULFUR COLLOID
2.0000 | Freq: Once | INTRAVENOUS | Status: AC | PRN
  Administered 2020-10-19: 2 via ORAL

## 2021-06-26 DIAGNOSIS — H04123 Dry eye syndrome of bilateral lacrimal glands: Secondary | ICD-10-CM | POA: Diagnosis not present

## 2021-06-26 DIAGNOSIS — H40033 Anatomical narrow angle, bilateral: Secondary | ICD-10-CM | POA: Diagnosis not present

## 2021-06-26 DIAGNOSIS — H524 Presbyopia: Secondary | ICD-10-CM | POA: Diagnosis not present

## 2021-07-31 DIAGNOSIS — M542 Cervicalgia: Secondary | ICD-10-CM | POA: Diagnosis not present

## 2021-07-31 DIAGNOSIS — M5412 Radiculopathy, cervical region: Secondary | ICD-10-CM | POA: Diagnosis not present

## 2021-08-17 DIAGNOSIS — M50221 Other cervical disc displacement at C4-C5 level: Secondary | ICD-10-CM | POA: Diagnosis not present

## 2021-08-17 DIAGNOSIS — M50223 Other cervical disc displacement at C6-C7 level: Secondary | ICD-10-CM | POA: Diagnosis not present

## 2021-08-17 DIAGNOSIS — M4722 Other spondylosis with radiculopathy, cervical region: Secondary | ICD-10-CM | POA: Diagnosis not present

## 2021-08-17 DIAGNOSIS — M50222 Other cervical disc displacement at C5-C6 level: Secondary | ICD-10-CM | POA: Diagnosis not present

## 2021-09-05 ENCOUNTER — Other Ambulatory Visit: Payer: Self-pay | Admitting: Family Medicine

## 2021-09-05 ENCOUNTER — Ambulatory Visit
Admission: RE | Admit: 2021-09-05 | Discharge: 2021-09-05 | Disposition: A | Discharge status: Home / Self Care | Payer: BC Managed Care – PPO | Source: Ambulatory Visit | Attending: Family Medicine | Admitting: Family Medicine

## 2021-09-05 DIAGNOSIS — R1032 Left lower quadrant pain: Secondary | ICD-10-CM | POA: Diagnosis not present

## 2021-09-05 MED ORDER — IOPAMIDOL (ISOVUE-300) INJECTION 61%
100.0000 mL | Freq: Once | INTRAVENOUS | Status: AC | PRN
  Administered 2021-09-05: 100 mL via INTRAVENOUS

## 2021-09-12 DIAGNOSIS — R109 Unspecified abdominal pain: Secondary | ICD-10-CM | POA: Diagnosis not present

## 2021-09-12 DIAGNOSIS — R1032 Left lower quadrant pain: Secondary | ICD-10-CM | POA: Diagnosis not present

## 2021-09-13 DIAGNOSIS — M542 Cervicalgia: Secondary | ICD-10-CM | POA: Diagnosis not present

## 2021-09-13 DIAGNOSIS — M47892 Other spondylosis, cervical region: Secondary | ICD-10-CM | POA: Diagnosis not present

## 2021-09-13 DIAGNOSIS — M5412 Radiculopathy, cervical region: Secondary | ICD-10-CM | POA: Diagnosis not present

## 2021-09-14 DIAGNOSIS — N200 Calculus of kidney: Secondary | ICD-10-CM | POA: Diagnosis not present

## 2021-09-14 DIAGNOSIS — R1084 Generalized abdominal pain: Secondary | ICD-10-CM | POA: Diagnosis not present

## 2021-09-14 DIAGNOSIS — R1032 Left lower quadrant pain: Secondary | ICD-10-CM | POA: Diagnosis not present

## 2021-09-14 DIAGNOSIS — N5201 Erectile dysfunction due to arterial insufficiency: Secondary | ICD-10-CM | POA: Diagnosis not present

## 2021-09-20 DIAGNOSIS — G4733 Obstructive sleep apnea (adult) (pediatric): Secondary | ICD-10-CM | POA: Diagnosis not present

## 2021-10-06 DIAGNOSIS — M47816 Spondylosis without myelopathy or radiculopathy, lumbar region: Secondary | ICD-10-CM | POA: Diagnosis not present

## 2021-10-06 DIAGNOSIS — M5412 Radiculopathy, cervical region: Secondary | ICD-10-CM | POA: Diagnosis not present

## 2021-10-06 DIAGNOSIS — M797 Fibromyalgia: Secondary | ICD-10-CM | POA: Diagnosis not present

## 2021-10-12 DIAGNOSIS — Z86010 Personal history of colonic polyps: Secondary | ICD-10-CM | POA: Diagnosis not present

## 2021-10-12 DIAGNOSIS — Z800 Family history of malignant neoplasm of digestive organs: Secondary | ICD-10-CM | POA: Diagnosis not present

## 2021-10-12 DIAGNOSIS — R1032 Left lower quadrant pain: Secondary | ICD-10-CM | POA: Diagnosis not present

## 2021-10-16 DIAGNOSIS — G4733 Obstructive sleep apnea (adult) (pediatric): Secondary | ICD-10-CM | POA: Diagnosis not present

## 2021-10-31 DIAGNOSIS — M47816 Spondylosis without myelopathy or radiculopathy, lumbar region: Secondary | ICD-10-CM | POA: Diagnosis not present

## 2021-11-20 DIAGNOSIS — M47816 Spondylosis without myelopathy or radiculopathy, lumbar region: Secondary | ICD-10-CM | POA: Diagnosis not present

## 2021-12-12 DIAGNOSIS — G4733 Obstructive sleep apnea (adult) (pediatric): Secondary | ICD-10-CM | POA: Diagnosis not present

## 2021-12-14 DIAGNOSIS — Z86010 Personal history of colonic polyps: Secondary | ICD-10-CM | POA: Diagnosis not present

## 2021-12-14 DIAGNOSIS — R1032 Left lower quadrant pain: Secondary | ICD-10-CM | POA: Diagnosis not present

## 2021-12-14 DIAGNOSIS — D124 Benign neoplasm of descending colon: Secondary | ICD-10-CM | POA: Diagnosis not present

## 2021-12-14 DIAGNOSIS — K649 Unspecified hemorrhoids: Secondary | ICD-10-CM | POA: Diagnosis not present

## 2021-12-14 DIAGNOSIS — K5730 Diverticulosis of large intestine without perforation or abscess without bleeding: Secondary | ICD-10-CM | POA: Diagnosis not present

## 2021-12-14 DIAGNOSIS — Z800 Family history of malignant neoplasm of digestive organs: Secondary | ICD-10-CM | POA: Diagnosis not present

## 2021-12-18 DIAGNOSIS — D124 Benign neoplasm of descending colon: Secondary | ICD-10-CM | POA: Diagnosis not present

## 2021-12-20 DIAGNOSIS — Z125 Encounter for screening for malignant neoplasm of prostate: Secondary | ICD-10-CM | POA: Diagnosis not present

## 2021-12-20 DIAGNOSIS — E291 Testicular hypofunction: Secondary | ICD-10-CM | POA: Diagnosis not present

## 2021-12-20 DIAGNOSIS — E782 Mixed hyperlipidemia: Secondary | ICD-10-CM | POA: Diagnosis not present

## 2021-12-20 DIAGNOSIS — Z0000 Encounter for general adult medical examination without abnormal findings: Secondary | ICD-10-CM | POA: Diagnosis not present

## 2021-12-20 DIAGNOSIS — K219 Gastro-esophageal reflux disease without esophagitis: Secondary | ICD-10-CM | POA: Diagnosis not present

## 2022-01-12 DIAGNOSIS — G4733 Obstructive sleep apnea (adult) (pediatric): Secondary | ICD-10-CM | POA: Diagnosis not present

## 2022-01-15 DIAGNOSIS — R1032 Left lower quadrant pain: Secondary | ICD-10-CM | POA: Diagnosis not present

## 2022-01-15 DIAGNOSIS — R198 Other specified symptoms and signs involving the digestive system and abdomen: Secondary | ICD-10-CM | POA: Diagnosis not present

## 2022-01-16 DIAGNOSIS — G4733 Obstructive sleep apnea (adult) (pediatric): Secondary | ICD-10-CM | POA: Diagnosis not present

## 2022-01-17 DIAGNOSIS — M47816 Spondylosis without myelopathy or radiculopathy, lumbar region: Secondary | ICD-10-CM | POA: Diagnosis not present

## 2022-02-11 DIAGNOSIS — G4733 Obstructive sleep apnea (adult) (pediatric): Secondary | ICD-10-CM | POA: Diagnosis not present

## 2022-02-14 DIAGNOSIS — G4733 Obstructive sleep apnea (adult) (pediatric): Secondary | ICD-10-CM | POA: Diagnosis not present

## 2022-02-15 DIAGNOSIS — M47816 Spondylosis without myelopathy or radiculopathy, lumbar region: Secondary | ICD-10-CM | POA: Diagnosis not present

## 2022-03-26 DIAGNOSIS — R1032 Left lower quadrant pain: Secondary | ICD-10-CM | POA: Diagnosis not present

## 2022-03-26 DIAGNOSIS — K5900 Constipation, unspecified: Secondary | ICD-10-CM | POA: Diagnosis not present

## 2022-05-18 DIAGNOSIS — M2241 Chondromalacia patellae, right knee: Secondary | ICD-10-CM | POA: Diagnosis not present

## 2022-05-21 ENCOUNTER — Other Ambulatory Visit: Payer: Self-pay | Admitting: Family Medicine

## 2022-05-21 DIAGNOSIS — M2241 Chondromalacia patellae, right knee: Secondary | ICD-10-CM

## 2022-05-22 DIAGNOSIS — G4733 Obstructive sleep apnea (adult) (pediatric): Secondary | ICD-10-CM | POA: Diagnosis not present

## 2022-05-29 ENCOUNTER — Ambulatory Visit
Admission: RE | Admit: 2022-05-29 | Discharge: 2022-05-29 | Disposition: A | Discharge status: Home / Self Care | Payer: BC Managed Care – PPO | Source: Ambulatory Visit | Attending: Family Medicine | Admitting: Family Medicine

## 2022-05-29 DIAGNOSIS — M2241 Chondromalacia patellae, right knee: Secondary | ICD-10-CM

## 2022-06-03 ENCOUNTER — Other Ambulatory Visit: Payer: Medicare Other

## 2022-06-18 DIAGNOSIS — M25562 Pain in left knee: Secondary | ICD-10-CM | POA: Diagnosis not present

## 2022-06-26 DIAGNOSIS — H43812 Vitreous degeneration, left eye: Secondary | ICD-10-CM | POA: Diagnosis not present

## 2022-06-26 DIAGNOSIS — H04123 Dry eye syndrome of bilateral lacrimal glands: Secondary | ICD-10-CM | POA: Diagnosis not present

## 2022-06-26 DIAGNOSIS — H524 Presbyopia: Secondary | ICD-10-CM | POA: Diagnosis not present

## 2022-09-19 ENCOUNTER — Ambulatory Visit (INDEPENDENT_AMBULATORY_CARE_PROVIDER_SITE_OTHER): Payer: No Typology Code available for payment source | Admitting: Ophthalmology

## 2022-09-19 ENCOUNTER — Encounter (INDEPENDENT_AMBULATORY_CARE_PROVIDER_SITE_OTHER): Payer: Self-pay | Admitting: Ophthalmology

## 2022-09-19 DIAGNOSIS — Z961 Presence of intraocular lens: Secondary | ICD-10-CM | POA: Diagnosis not present

## 2022-09-19 DIAGNOSIS — H43813 Vitreous degeneration, bilateral: Secondary | ICD-10-CM

## 2022-09-19 DIAGNOSIS — H43393 Other vitreous opacities, bilateral: Secondary | ICD-10-CM | POA: Diagnosis not present

## 2022-09-19 NOTE — Progress Notes (Signed)
[facility] - Clinic Note  09/19/2022   CHIEF COMPLAINT Patient presents for Retina Evaluation  HISTORY OF PRESENT ILLNESS: Daniel Novak is a 62 y.o. male who presents to the clinic today for:  HPI     Retina Evaluation   In both eyes.  This started years ago.  Associated Symptoms Floaters and Distortion.  Negative for Flashes.  I, the attending physician,  performed the HPI with the patient and updated documentation appropriately.        Comments   Patient feels that he is seeing a ton of floaters for years now in both eyes. He does not see flashes. He states that he also sees a film over both eyes.       Last edited by Rennis Chris, MD on 09/19/2022 12:55 PM.    Pt is referred here by Dr. Allyne Gee bc they do not take his insurance, pt states he has a bunch of floaters in both eyes and he also feels like he has a film over both eyes, he has been seeing these for "years" but feels like it got worse after cataract sx, pt was scheduled for PPV with Dr. Allyne Gee, but the surgery center does not take his insurance, pt had cataract sx OU with Dr. Delaney Meigs, pt states he is pre-diabetic, but not hypertensive  Referring physician: Wilfrid Lund, PA [address] [place],  [place] [address]  HISTORICAL INFORMATION:  Selected notes from the MEDICAL RECORD NUMBER Referred by Dr. Allyne Gee for visually significant vit opacities and PCO OU LEE:  Ocular Hx- PMH-   CURRENT MEDICATIONS: Current Outpatient Medications (Ophthalmic Drugs)  Medication Sig   cycloSPORINE (RESTASIS OP) Apply to eye daily as needed.   Propylene Glycol (SYSTANE BALANCE OP) Apply to eye daily as needed.   No current facility-administered medications for this visit. (Ophthalmic Drugs)   Current Outpatient Medications (Other)  Medication Sig   APPLE CIDER VINEGAR PO Take by mouth daily.   citalopram (CELEXA) 40 MG tablet Take 40 mg by mouth daily.   famotidine (PEPCID) 40 MG tablet  Pepcid 40 mg tablet   40 mg every day by oral route.   Glucosamine Sulfate 1000 MG CAPS Take 1 capsule (1,000 mg total) by mouth 2 (two) times daily.   MELATONIN PO Take by mouth at bedtime.   Multiple Vitamin (MULTIVITAMIN) tablet Take 1 tablet by mouth daily.   sildenafil (REVATIO) 20 MG tablet TAKE 2 TO 5 TABLETS BY MOUTH 30 MINUTES PRIOR TO SEXUAL ACTIVITY   Testosterone 20.25 MG/ACT (1.62%) GEL    Cholecalciferol (VITAMIN D-3) 125 MCG (5000 UT) TABS Take 1 tablet by mouth daily.   Cholecalciferol (VITAMIN D3) 25 MCG (1000 UT) CAPS Take by mouth daily.   diclofenac Sodium (VOLTAREN) 1 % GEL Apply 4 g topically 4 (four) times daily as needed.   No current facility-administered medications for this visit. (Other)   REVIEW OF SYSTEMS: ROS   Positive for: Eyes Last edited by Julieanne Cotton, COT on 09/19/2022  7:40 AM.     ALLERGIES Allergies  Allergen Reactions   Erythromycin Other (See Comments)   Penicillins Hives   PAST MEDICAL HISTORY History reviewed. No pertinent past medical history. History reviewed. No pertinent surgical history. FAMILY HISTORY History reviewed. No pertinent family history. SOCIAL HISTORY       OPHTHALMIC EXAM:  Base Eye Exam     Visual Acuity (Snellen - Linear)       Right  Left   Dist [place] 20/20 20/20         Tonometry (Tonopen, 7:43 AM)       Right Left   Pressure 13 14         Pupils       Dark Light Shape React APD   Right 2 2 Round Brisk None   Left 2 2 Round Brisk None         Visual Fields       Left Right    Full Full         Extraocular Movement       Right Left    Full, Ortho Full, Ortho         Neuro/Psych     Oriented x3: Yes         Dilation     Both eyes: 1.0% Mydriacyl, 2.5% Phenylephrine @ 7:41 AM           Slit Lamp and Fundus Exam     Slit Lamp Exam       Right Left   Lids/Lashes Dermatochalasis - upper lid Dermatochalasis - upper lid   Conjunctiva/Sclera temporal  pinguecula White and quiet   Cornea Trace tear film debris, well healed cataract wound Trace tear film debris, well healed cataract wound   Anterior Chamber deep and clear deep and clear   Iris Round and dilated Round and dilated   Lens PC IOL in good position, 1+ Posterior capsular opacification PC IOL in good position   Anterior Vitreous Vitreous syneresis, Posterior vitreous detachment, vitreous condensations Vitreous syneresis, Posterior vitreous detachment, vitreous condensations         Fundus Exam       Right Left   Disc Pink and Sharp Pink and Sharp   C/D Ratio 0.2 0.2   Macula Flat, Good foveal reflex Flat, Good foveal reflex   Vessels attenuated, mild tortuosity attenuated, mild tortuosity   Periphery Attached, no edema Attached           IMAGING AND PROCEDURES  Imaging and Procedures for 09/19/2022  OCT, Retina - OU - Both Eyes       Right Eye Quality was good. Central Foveal Thickness: 256. Progression has no prior data. Findings include normal foveal contour, no IRF, no SRF.   Left Eye Quality was good. Central Foveal Thickness: 260. Progression has no prior data. Findings include normal foveal contour, no IRF, no SRF.   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 CME - cystoid macular edema. PED - pigment epithelial detachment. IRF - intraretinal fluid. SRF - subretinal fluid. EZ - ellipsoid zone. ERM - epiretinal membrane. ORA - outer retinal atrophy. ORT - outer retinal tubulation. SRHM - subretinal hyper-reflective material. IRHM - intraretinal hyper-reflective material           ASSESSMENT/PLAN:   ICD-10-CM   1. Posterior vitreous detachment of both eyes  H43.813 OCT, Retina - OU - Both Eyes    2. Vitreous opacities of both eyes  H43.393 OCT, Retina - OU - Both Eyes    3. Pseudophakia, both eyes  Z96.1      1,2. PVD w/  visually significant vitreous opacities  - pt reports debilitating vitreous opacities x 5 years OU (OS > OD)  - BCVA 20/20 OU, but pt reports intermittent periods with inability to read due to vitreous opacities /  floaters OU  - was going to schedule PPV w/ Dr. Allyne Gee, but ran into insurance issues with the surgery center  - discussed findings, prognosis and treatment options including vitrectomy  - pt interested in surgery -- long discussion about the risks / benefits of PPV for vitreous opacities  - recommend holding off on surgery for now   - f/u om 6 weeks, DFE, OCT ?surgical planning?  3. Pseudophakia OU  - s/p CE/IOL OU (Dr. Delaney Meigs)  - IOLs in good position, doing well  - monitor   Ophthalmic Meds Ordered this visit:  No orders of the defined types were placed in this encounter.    Return in about 6 weeks (around 10/31/2022) for f/u vitreous opacities/floaters OU, DFE, OCT.  There are no Patient Instructions on file for this visit.  Explained the diagnoses, plan, and follow up with the patient and they expressed understanding.  Patient expressed understanding of the importance of proper follow up care.   This document serves as a record of services personally performed by Karie Chimera, MD, PhD. It was created on their behalf by Glee Arvin. Manson Passey, OA an ophthalmic technician. The creation of this record is the provider's dictation and/or activities during the visit.    Electronically signed by: Glee Arvin. Kristopher Oppenheim 05.29.2024 12:55 PM  Karie Chimera, M.D., Ph.D. Diseases & Surgery of the Retina and Vitreous [facility] 09/19/2022  I have reviewed the above documentation for accuracy and completeness, and I agree with the above. Karie Chimera, M.D., Ph.D. 09/19/22 1:00 PM   Abbreviations: M myopia (nearsighted); A astigmatism; H hyperopia (farsighted); P presbyopia; Mrx spectacle prescription;  CTL contact lenses; OD right eye; OS left eye; OU  both eyes  XT exotropia; ET esotropia; PEK punctate epithelial keratitis; PEE punctate epithelial erosions; DES dry eye syndrome; MGD meibomian gland dysfunction; ATs artificial tears; PFAT's preservative free artificial tears; NSC nuclear sclerotic cataract; PSC posterior subcapsular cataract; ERM epi-retinal membrane; PVD posterior vitreous detachment; RD retinal detachment; DM diabetes mellitus; DR diabetic retinopathy; NPDR non-proliferative diabetic retinopathy; PDR proliferative diabetic retinopathy; CSME clinically significant macular edema; DME diabetic macular edema; dbh dot blot hemorrhages; CWS cotton wool spot; POAG primary open angle glaucoma; C/D cup-to-disc ratio; HVF humphrey visual field; GVF goldmann visual field; OCT optical coherence tomography; IOP intraocular pressure; BRVO Branch retinal vein occlusion; CRVO central retinal vein occlusion; CRAO central retinal artery occlusion; BRAO branch retinal artery occlusion; RT retinal tear; SB scleral buckle; PPV pars plana vitrectomy; VH Vitreous hemorrhage; PRP panretinal laser photocoagulation; IVK intravitreal kenalog; VMT vitreomacular traction; MH Macular hole;  NVD neovascularization of the disc; NVE neovascularization elsewhere; AREDS age related eye disease study; ARMD age related macular degeneration; POAG primary open angle glaucoma; EBMD epithelial/anterior basement membrane dystrophy; ACIOL anterior chamber intraocular lens; IOL intraocular lens; PCIOL posterior chamber intraocular lens; Phaco/IOL phacoemulsification with intraocular lens placement; PRK photorefractive keratectomy; LASIK laser assisted in situ keratomileusis; HTN hypertension; DM diabetes mellitus; COPD chronic obstructive pulmonary disease

## 2022-10-23 NOTE — Progress Notes (Signed)
[facility] - Clinic Note  10/29/2022   CHIEF COMPLAINT Patient presents for Retina Follow Up  HISTORY OF PRESENT ILLNESS: Daniel Novak is a 62 y.o. male who presents to the clinic today for:  HPI     Retina Follow Up   Patient presents with  PVD.  In both eyes.  Severity is moderate.  Duration of 5 weeks.  Since onset it is stable.  I, the attending physician,  performed the HPI with the patient and updated documentation appropriately.        Comments   Pt here for 5 wk ret f/u PVD/vit floaters OU. Pt states VA is the same, floaters still prominent. There all of the time and obstruct NVA.       Last edited by Rennis Chris, MD on 10/29/2022  5:19 PM.    Pt states he is still wanting to have surgery for his floaters, he states he can't tell which eye has more floaters in it  Referring physician: Wilfrid Lund, PA [address] [place],  [place] [address]  HISTORICAL INFORMATION:  Selected notes from the MEDICAL RECORD NUMBER Referred by Dr. Allyne Gee for visually significant vit opacities and PCO OU LEE:  Ocular Hx- PMH-   CURRENT MEDICATIONS: Current Outpatient Medications (Ophthalmic Drugs)  Medication Sig   cycloSPORINE (RESTASIS OP) Apply to eye daily as needed.   Propylene Glycol (SYSTANE BALANCE OP) Apply to eye daily as needed.   No current facility-administered medications for this visit. (Ophthalmic Drugs)   Current Outpatient Medications (Other)  Medication Sig   APPLE CIDER VINEGAR PO Take by mouth daily.   citalopram (CELEXA) 40 MG tablet Take 40 mg by mouth daily.   famotidine (PEPCID) 40 MG tablet Pepcid 40 mg tablet   40 mg every day by oral route.   MELATONIN PO Take by mouth at bedtime.   Multiple Vitamin (MULTIVITAMIN) tablet Take 1 tablet by mouth daily.   sildenafil (REVATIO) 20 MG tablet TAKE 2 TO 5 TABLETS BY MOUTH 30 MINUTES PRIOR TO SEXUAL ACTIVITY   Testosterone 20.25 MG/ACT (1.62%) GEL    Cholecalciferol (VITAMIN  D-3) 125 MCG (5000 UT) TABS Take 1 tablet by mouth daily.   Cholecalciferol (VITAMIN D3) 25 MCG (1000 UT) CAPS Take by mouth daily.   diclofenac Sodium (VOLTAREN) 1 % GEL Apply 4 g topically 4 (four) times daily as needed.   Glucosamine Sulfate 1000 MG CAPS Take 1 capsule (1,000 mg total) by mouth 2 (two) times daily.   No current facility-administered medications for this visit. (Other)   REVIEW OF SYSTEMS: ROS   Positive for: Eyes Negative for: Constitutional, Gastrointestinal, Neurological, Skin, Genitourinary, Musculoskeletal, HENT, Endocrine, Cardiovascular, Respiratory, Psychiatric, Allergic/Imm, Heme/Lymph Last edited by Thompson Grayer, COT on 10/29/2022  1:06 PM.      ALLERGIES Allergies  Allergen Reactions   Erythromycin Other (See Comments)   Penicillins Hives   PAST MEDICAL HISTORY History reviewed. No pertinent past medical history. History reviewed. No pertinent surgical history. FAMILY HISTORY History reviewed. No pertinent family history. SOCIAL HISTORY Social History   Tobacco Use   Smoking status: Never   Smokeless tobacco: Never  Vaping Use   Vaping Use: Never used  Substance Use Topics   Alcohol use: Never   Drug use: Never       OPHTHALMIC EXAM:  Base Eye Exam     Visual Acuity (Snellen - Linear)       Right Left  Dist [place] 20/20 20/20         Tonometry (Tonopen, 1:11 PM)       Right Left   Pressure 14 15         Pupils       Pupils Dark Light Shape React APD   Right PERRL 3 2 Round Brisk None   Left PERRL 3 2 Round Brisk None         Visual Fields (Counting fingers)       Left Right    Full Full         Extraocular Movement       Right Left    Full, Ortho Full, Ortho         Neuro/Psych     Oriented x3: Yes   Mood/Affect: Normal         Dilation     Both eyes: 1.0% Mydriacyl, 2.5% Phenylephrine @ 1:13 PM           Slit Lamp and Fundus Exam     Slit Lamp Exam       Right Left   Lids/Lashes  Dermatochalasis - upper lid Dermatochalasis - upper lid   Conjunctiva/Sclera temporal pinguecula White and quiet   Cornea Trace tear film debris, well healed cataract wound Mild tear film debris, well healed cataract wound   Anterior Chamber deep and clear deep and clear   Iris Round and dilated Round and dilated   Lens PC IOL in good position, 1+ Posterior capsular opacification PC IOL in good position   Anterior Vitreous Vitreous syneresis, Posterior vitreous detachment, vitreous condensations Vitreous syneresis, Posterior vitreous detachment, vitreous condensations         Fundus Exam       Right Left   Disc Pink and Sharp Pink and Sharp   C/D Ratio 0.2 0.2   Macula Flat, Good foveal reflex Flat, Good foveal reflex, No heme or edema   Vessels mild tortuosity mild attenuation, mild tortuosity, AV crossing changes   Periphery Attached, no edema Attached           IMAGING AND PROCEDURES  Imaging and Procedures for 10/29/2022  OCT, Retina - OU - Both Eyes       Right Eye Quality was good. Central Foveal Thickness: 256. Progression has been stable. Findings include normal foveal contour, no IRF, no SRF.   Left Eye Quality was good. Central Foveal Thickness: 262. Progression has been stable. Findings include normal foveal contour, no IRF, no SRF.   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 CME - cystoid macular edema. PED - pigment epithelial detachment. IRF - intraretinal fluid. SRF - subretinal fluid. EZ - ellipsoid zone. ERM - epiretinal membrane. ORA - outer retinal atrophy. ORT - outer retinal tubulation. SRHM - subretinal hyper-reflective material. IRHM - intraretinal hyper-reflective material           ASSESSMENT/PLAN:   ICD-10-CM   1. Posterior vitreous detachment of both eyes  H43.813 OCT, Retina - OU - Both Eyes    2. Vitreous  opacities of both eyes  H43.393     3. Pseudophakia, both eyes  Z96.1       1,2. PVD w/ visually significant vitreous opacities  - pt reports debilitating vitreous opacities x 5 years OU (OS > OD)  - BCVA 20/20 OU, but pt reports intermittent periods with inability to  read due to vitreous opacities / floaters OU  - was going to schedule PPV w/ Dr. Allyne Gee, but ran into insurance issues with the surgery center  - discussed findings, prognosis and treatment options including vitrectomy  - pt interested in surgery -- long discussion about the risks / benefits of PPV for vitreous opacities  - pt wishes to proceed with PPV for visually significant vitreous opacities  - pt is going to talk to his wife and call back with preferred surgery date, but tentatively planning for Thursday, Aug 1st -- PPV OS  - will need clearance from PCP and anesthesia (h/o OSA)  - f/u Aug 2nd -- POV1 OS  3. Pseudophakia OU  - s/p CE/IOL OU (Dr. Delaney Meigs)  - IOLs in good position, doing well  - mild PCO OU -- may benefit from YAG laser prior to PPV  - monitor   Ophthalmic Meds Ordered this visit:  No orders of the defined types were placed in this encounter.    Return in about 25 days (around 11/23/2022) for f/u floaters OS, POV.  There are no Patient Instructions on file for this visit.  Explained the diagnoses, plan, and follow up with the patient and they expressed understanding.  Patient expressed understanding of the importance of proper follow up care.   This document serves as a record of services personally performed by Karie Chimera, MD, PhD. It was created on their behalf by De Blanch, an ophthalmic technician. The creation of this record is the provider's dictation and/or activities during the visit.    Electronically signed by: De Blanch, OA, 10/29/22  5:19 PM  This document serves as a record of services personally performed by Karie Chimera, MD, PhD. It was created on their  behalf by Glee Arvin. Manson Passey, OA an ophthalmic technician. The creation of this record is the provider's dictation and/or activities during the visit.    Electronically signed by: Glee Arvin. Manson Passey, OA 10/29/22 5:19 PM  Karie Chimera, M.D., Ph.D. Diseases & Surgery of the Retina and Vitreous [facility] 10/29/2022  I have reviewed the above documentation for accuracy and completeness, and I agree with the above. Karie Chimera, M.D., Ph.D. 10/29/22 5:23 PM   Abbreviations: M myopia (nearsighted); A astigmatism; H hyperopia (farsighted); P presbyopia; Mrx spectacle prescription;  CTL contact lenses; OD right eye; OS left eye; OU both eyes  XT exotropia; ET esotropia; PEK punctate epithelial keratitis; PEE punctate epithelial erosions; DES dry eye syndrome; MGD meibomian gland dysfunction; ATs artificial tears; PFAT's preservative free artificial tears; NSC nuclear sclerotic cataract; PSC posterior subcapsular cataract; ERM epi-retinal membrane; PVD posterior vitreous detachment; RD retinal detachment; DM diabetes mellitus; DR diabetic retinopathy; NPDR non-proliferative diabetic retinopathy; PDR proliferative diabetic retinopathy; CSME clinically significant macular edema; DME diabetic macular edema; dbh dot blot hemorrhages; CWS cotton wool spot; POAG primary open angle glaucoma; C/D cup-to-disc ratio; HVF humphrey visual field; GVF goldmann visual field; OCT optical coherence tomography; IOP intraocular pressure; BRVO Branch retinal vein occlusion; CRVO central retinal vein occlusion; CRAO central retinal artery occlusion; BRAO branch retinal artery occlusion; RT retinal tear; SB scleral buckle; PPV pars plana vitrectomy; VH Vitreous hemorrhage; PRP panretinal laser photocoagulation; IVK intravitreal kenalog; VMT vitreomacular traction; MH Macular hole;  NVD neovascularization of the disc; NVE neovascularization elsewhere; AREDS age related eye disease study; ARMD age related macular  degeneration; POAG primary open angle glaucoma; EBMD epithelial/anterior basement membrane dystrophy; ACIOL anterior chamber intraocular lens; IOL intraocular lens;  PCIOL posterior chamber intraocular lens; Phaco/IOL phacoemulsification with intraocular lens placement; PRK photorefractive keratectomy; LASIK laser assisted in situ keratomileusis; HTN hypertension; DM diabetes mellitus; COPD chronic obstructive pulmonary disease

## 2022-10-29 ENCOUNTER — Ambulatory Visit (INDEPENDENT_AMBULATORY_CARE_PROVIDER_SITE_OTHER): Payer: No Typology Code available for payment source | Admitting: Ophthalmology

## 2022-10-29 ENCOUNTER — Encounter (INDEPENDENT_AMBULATORY_CARE_PROVIDER_SITE_OTHER): Payer: Self-pay | Admitting: Ophthalmology

## 2022-10-29 DIAGNOSIS — Z961 Presence of intraocular lens: Secondary | ICD-10-CM | POA: Diagnosis not present

## 2022-10-29 DIAGNOSIS — H43393 Other vitreous opacities, bilateral: Secondary | ICD-10-CM | POA: Diagnosis not present

## 2022-10-29 DIAGNOSIS — H43813 Vitreous degeneration, bilateral: Secondary | ICD-10-CM | POA: Diagnosis not present

## 2022-11-23 ENCOUNTER — Encounter (INDEPENDENT_AMBULATORY_CARE_PROVIDER_SITE_OTHER): Payer: BC Managed Care – PPO | Admitting: Ophthalmology

## 2022-11-29 ENCOUNTER — Ambulatory Visit (HOSPITAL_COMMUNITY): Admission: RE | Admit: 2022-11-29 | Payer: BC Managed Care – PPO | Source: Home / Self Care | Admitting: Ophthalmology

## 2022-11-29 ENCOUNTER — Encounter (HOSPITAL_COMMUNITY): Admission: RE | Payer: Self-pay | Source: Home / Self Care

## 2022-11-29 SURGERY — PARS PLANA VITRECTOMY WITH 25 GAUGE
Anesthesia: General | Laterality: Right

## 2022-11-30 ENCOUNTER — Encounter (INDEPENDENT_AMBULATORY_CARE_PROVIDER_SITE_OTHER): Payer: BC Managed Care – PPO | Admitting: Ophthalmology

## 2022-12-03 ENCOUNTER — Encounter (HOSPITAL_COMMUNITY): Payer: Self-pay | Admitting: Ophthalmology

## 2022-12-03 ENCOUNTER — Other Ambulatory Visit: Payer: Self-pay

## 2022-12-03 NOTE — Progress Notes (Signed)
Daniel Novak denies chest pain or shortness of breath. Patient denies having any s/s of Covid in his household, also denies any known exposure to Covid. Daniel Novak denies  any s/s of upper or lower respiratory infection in the past 8 weeks.   Daniel Novak's PCP is Luther Parody at [facility].  Patient is Pre- diabetic, last A1c WAS 5.7. I instructed patient to hold all vitamins or herbal products, until after surgery.

## 2022-12-04 NOTE — Progress Notes (Signed)
[facility] - Clinic Note  12/07/2022   CHIEF COMPLAINT Patient presents for Retina Follow Up  HISTORY OF PRESENT ILLNESS: Daniel Novak is a 62 y.o. male who presents to the clinic today for:  HPI     Retina Follow Up   Patient presents with  Other.  In right eye.  Severity is moderate.  Duration of 1 day.  Since onset it is stable.  I, the attending physician,  performed the HPI with the patient and updated documentation appropriately.        Comments   Pt here for POD1 s/p PPV OD 08.15.24. Pt states he's feeling alright this morning. Was able to get some sleep. Pain scale is 0 this morning. Can see lights, shapes/objects.       Last edited by Rennis Chris, MD on 12/07/2022 12:58 PM.    Pt states he can already tell the right eye is clear, he states he is having a little pain, but not bad, he slept well last night  Referring physician: Wilfrid Lund, [place] [address] [place],  [place] [address]  HISTORICAL INFORMATION:  Selected notes from the MEDICAL RECORD NUMBER Referred by Dr. Allyne Gee for visually significant vit opacities and PCO OU LEE:  Ocular Hx- PMH-   CURRENT MEDICATIONS: Current Outpatient Medications (Ophthalmic Drugs)  Medication Sig   cycloSPORINE (RESTASIS) 0.05 % ophthalmic emulsion Apply 1 drop to eye 2 (two) times daily.   No current facility-administered medications for this visit. (Ophthalmic Drugs)   Current Outpatient Medications (Other)  Medication Sig   ALPRAZolam (XANAX) 0.5 MG tablet Take 0.5 mg by mouth 2 (two) times daily as needed for anxiety.   APPLE CIDER VINEGAR PO Take 1 capsule by mouth daily.   citalopram (CELEXA) 40 MG tablet Take 40 mg by mouth daily.   famotidine (PEPCID) 40 MG tablet Take 40 mg by mouth daily.   magnesium oxide (MAG-OX) 400 (240 Mg) MG tablet Take 400 mg by mouth daily.   Misc Natural Products (BLOOD SUGAR BALANCE PO) Take 2 tablets by mouth daily. Glucose tablets   Multiple  Vitamin (MULTIVITAMIN) tablet Take 1 tablet by mouth daily.   OVER THE COUNTER MEDICATION Take 2 capsules by mouth daily. Super Fruits and Veggies   pravastatin (PRAVACHOL) 10 MG tablet Take 10 mg by mouth at bedtime.   PRESCRIPTION MEDICATION Pt has CPAP machine   sildenafil (REVATIO) 20 MG tablet TAKE 2 TO 5 TABLETS BY MOUTH 30 MINUTES PRIOR TO SEXUAL ACTIVITY   Testosterone (ANDROGEL) 20.25 MG/1.25GM (1.62%) GEL Place 1 Application onto the skin daily.   No current facility-administered medications for this visit. (Other)   REVIEW OF SYSTEMS: ROS   Positive for: Eyes Negative for: Constitutional, Gastrointestinal, Neurological, Skin, Genitourinary, Musculoskeletal, HENT, Endocrine, Cardiovascular, Respiratory, Psychiatric, Allergic/Imm, Heme/Lymph Last edited by Thompson Grayer, COT on 12/07/2022  7:48 AM.     ALLERGIES Allergies  Allergen Reactions   Erythromycin Other (See Comments)    Stomach pain    Penicillins Hives   PAST MEDICAL HISTORY Past Medical History:  Diagnosis Date   Anxiety    Arthritis    Dry eye syndrome    post cataract surgery   Fibromyalgia    GERD (gastroesophageal reflux disease)    Headache    History of kidney stones    Pre-diabetes    Sleep apnea    Past Surgical History:  Procedure Laterality Date   CATARACT EXTRACTION Bilateral  CHOLECYSTECTOMY     COLONOSCOPY     HAMMER TOE SURGERY Right    INGUINAL HERNIA REPAIR     unsure which side   KNEE ARTHROSCOPY Right    PARS PLANA VITRECTOMY Right 12/06/2022   Procedure: PARS PLANA VITRECTOMY WITH 25 GAUGE;  Surgeon: Rennis Chris, MD;  Location: [facility] OR;  Service: Ophthalmology;  Laterality: Right;   PHOTOCOAGULATION WITH LASER Right 12/06/2022   Procedure: PHOTOCOAGULATION WITH LASER;  Surgeon: Rennis Chris, MD;  Location: [facility] OR;  Service: Ophthalmology;  Laterality: Right;   TONSILLECTOMY     FAMILY HISTORY History reviewed. No pertinent family history. SOCIAL HISTORY Social History    Tobacco Use   Smoking status: Never   Smokeless tobacco: Never  Vaping Use   Vaping status: Never Used  Substance Use Topics   Alcohol use: Not Currently   Drug use: Never      OPHTHALMIC EXAM:  Base Eye Exam     Visual Acuity (Snellen - Linear)       Right Left   Dist [place] 20/25 20/20 -2   Dist ph [place] NI          Tonometry (Tonopen, 7:52 AM)       Right Left   Pressure 18 15         Pupils       Pupils Dark Light Shape React APD   Right PERRL 6 6 Round NR None   Left PERRL 3 2 Round Brisk None         Visual Fields (Counting fingers)       Left Right    Full Full         Extraocular Movement       Right Left    Full, Ortho Full, Ortho         Neuro/Psych     Oriented x3: Yes   Mood/Affect: Normal         Dilation     Right eye: 1.0% Mydriacyl, 2.5% Phenylephrine @ 7:52 AM           Slit Lamp and Fundus Exam     Slit Lamp Exam       Right Left   Lids/Lashes Dermatochalasis - upper lid Dermatochalasis - upper lid   Conjunctiva/Sclera Mild Injection, sutures intact White and quiet   Cornea Trace tear film debris, well healed cataract wound Mild tear film debris, well healed cataract wound   Anterior Chamber deep and clear deep and clear   Iris Round and dilated Round and dilated   Lens PC IOL in good position, 1+ Posterior capsular opacification PC IOL in good position   Anterior Vitreous post vitrectomy, trace pigment Vitreous syneresis, Posterior vitreous detachment, vitreous condensations         Fundus Exam       Right Left   Disc Pink and Sharp Pink and Sharp   C/D Ratio 0.2 0.2   Macula Flat, Blunted foveal reflex, RPE mottling, No heme or edema Flat, Good foveal reflex, No heme or edema   Vessels mild attenuation, mild tortuosity mild attenuation, mild tortuosity, AV crossing changes   Periphery Attached, no edema, good 360 peripheral laser Attached           IMAGING AND PROCEDURES  Imaging and Procedures  for 12/07/2022  OCT, Retina - OU - Both Eyes       Right Eye Quality was good. Central Foveal Thickness: 268. Progression has been stable. Findings include normal foveal contour,  no IRF, no SRF.   Left Eye Quality was good. Central Foveal Thickness: 260. Progression has been stable. Findings include normal foveal contour, no IRF, no SRF.   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 CME - cystoid macular edema. PED - pigment epithelial detachment. IRF - intraretinal fluid. SRF - subretinal fluid. EZ - ellipsoid zone. ERM - epiretinal membrane. ORA - outer retinal atrophy. ORT - outer retinal tubulation. SRHM - subretinal hyper-reflective material. IRHM - intraretinal hyper-reflective material           ASSESSMENT/PLAN:   ICD-10-CM   1. Posterior vitreous detachment of both eyes  H43.813 OCT, Retina - OU - Both Eyes    2. Vitreous opacities of both eyes  H43.393 OCT, Retina - OU - Both Eyes    3. Pseudophakia, both eyes  Z96.1       1,2. 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 Allyne Gee, but ran into insurance issues with the surgery center  - now POD1 s/p PPV/EL OD, 08.15.2024             - doing well this morning             - IOP okay at 18             - start   PF 4x/day OD                          zymaxid QID OD                          Atropine BID OD                          PSO ung QID OD              - sleep with head elevated              - eye shield when sleeping              - post op drop and positioning instructions reviewed              - tylenol/ibuprofen for pain  - f/u next Thursday, DFE OD, OCT,  POV  3. Pseudophakia OU  - s/p CE/IOL OU (Dr. Delaney Meigs)  - IOLs in good position, doing well  - mild PCO OU -- may benefit from YAG laser prior to PPV  - monitor  Ophthalmic Meds Ordered this visit:  No orders of the defined types were placed in this encounter.    Return in about 6 days (around 12/13/2022) for f/u vitreous opacities OD, POV, DFE, OCT.  There are no Patient Instructions on file for this visit.  Explained the diagnoses, plan, and follow up with the patient and they expressed understanding.  Patient expressed understanding of the importance of proper follow up care.   This document serves as a record of services personally performed by Karie Chimera, MD, PhD. It was created on their behalf by Glee Arvin. Manson Passey,  OA an ophthalmic technician. The creation of this record is the provider's dictation and/or activities during the visit.    Electronically signed by: Glee Arvin. Manson Passey, OA 12/07/22 12:59 PM  Karie Chimera, M.D., Ph.D. Diseases & Surgery of the Retina and Vitreous [facility] 12/07/2022  I have reviewed the above documentation for accuracy and completeness, and I agree with the above. Karie Chimera, M.D., Ph.D. 12/07/22 1:01 PM   Abbreviations: M myopia (nearsighted); A astigmatism; H hyperopia (farsighted); P presbyopia; Mrx spectacle prescription;  CTL contact lenses; OD right eye; OS left eye; OU both eyes  XT exotropia; ET esotropia; PEK punctate epithelial keratitis; PEE punctate epithelial erosions; DES dry eye syndrome; MGD meibomian gland dysfunction; ATs artificial tears; PFAT's preservative free artificial tears; NSC nuclear sclerotic cataract; PSC posterior subcapsular cataract; ERM epi-retinal membrane; PVD posterior vitreous detachment; RD retinal detachment; DM diabetes mellitus; DR diabetic retinopathy; NPDR non-proliferative diabetic retinopathy; PDR proliferative diabetic retinopathy; CSME clinically significant macular  edema; DME diabetic macular edema; dbh dot blot hemorrhages; CWS cotton wool spot; POAG primary open angle glaucoma; C/D cup-to-disc ratio; HVF humphrey visual field; GVF goldmann visual field; OCT optical coherence tomography; IOP intraocular pressure; BRVO Branch retinal vein occlusion; CRVO central retinal vein occlusion; CRAO central retinal artery occlusion; BRAO branch retinal artery occlusion; RT retinal tear; SB scleral buckle; PPV pars plana vitrectomy; VH Vitreous hemorrhage; PRP panretinal laser photocoagulation; IVK intravitreal kenalog; VMT vitreomacular traction; MH Macular hole;  NVD neovascularization of the disc; NVE neovascularization elsewhere; AREDS age related eye disease study; ARMD age related macular degeneration; POAG primary open angle glaucoma; EBMD epithelial/anterior basement membrane dystrophy; ACIOL anterior chamber intraocular lens; IOL intraocular lens; PCIOL posterior chamber intraocular lens; Phaco/IOL phacoemulsification with intraocular lens placement; PRK photorefractive keratectomy; LASIK laser assisted in situ keratomileusis; HTN hypertension; DM diabetes mellitus; COPD chronic obstructive pulmonary disease

## 2022-12-04 NOTE — H&P (Signed)
Daniel Novak is an 62 y.o. male.    Chief Complaint: visually significant vitreous opacities  HPI: Pt with long standing history of visually significant vitreous opacities affecting activities of daily living. After a discussion of the risks, benefits and alternatives to surgery, the patient has elected to proceed with surgery to remove these visually significant vitreous opacities in the right eye via 25g PPV under general anesthesia.  Past Medical History:  Diagnosis Date   Anxiety    Arthritis    Dry eye syndrome    post cataract surgery   Fibromyalgia    GERD (gastroesophageal reflux disease)    Headache    History of kidney stones    Pre-diabetes    Sleep apnea     Past Surgical History:  Procedure Laterality Date   CATARACT EXTRACTION Bilateral    CHOLECYSTECTOMY     COLONOSCOPY     HAMMER TOE SURGERY Right    INGUINAL HERNIA REPAIR     unsure which side   KNEE ARTHROSCOPY Right    TONSILLECTOMY      History reviewed. No pertinent family history. Social History:  reports that he has never smoked. He has never used smokeless tobacco. He reports that he does not currently use alcohol. He reports that he does not use drugs.  Allergies:  Allergies  Allergen Reactions   Erythromycin Other (See Comments)    Stomach pain    Penicillins Hives    No medications prior to admission.    Review of systems otherwise negative  Height 5\' 7"  (1.702 m), weight 69.4 kg.  Physical exam: Mental status: oriented x3. Eyes: See eye exam associated with this date of surgery Ears, Nose, Throat: within normal limits Neck: Within Normal limits General: within normal limits Chest: Within normal limits Breast: deferred Heart: Within normal limits Abdomen: Within normal limits GU: deferred Extremities: within normal limits Skin: within normal limits  Assessment/Plan 1. Visually significant vitreous opacities, RIGHT EYE  Plan: To [facility] for 25g PPV  Karie Chimera,  M.D., Ph.D. Vitreoretinal Surgeon [facility]

## 2022-12-06 ENCOUNTER — Ambulatory Visit (HOSPITAL_COMMUNITY): Payer: No Typology Code available for payment source | Admitting: Certified Registered Nurse Anesthetist

## 2022-12-06 ENCOUNTER — Encounter (HOSPITAL_COMMUNITY): Payer: Self-pay | Admitting: Ophthalmology

## 2022-12-06 ENCOUNTER — Other Ambulatory Visit: Payer: Self-pay

## 2022-12-06 ENCOUNTER — Encounter (HOSPITAL_COMMUNITY)
Admission: RE | Disposition: A | Discharge status: Home / Self Care | Payer: Self-pay | Source: Home / Self Care | Attending: Ophthalmology

## 2022-12-06 ENCOUNTER — Ambulatory Visit (HOSPITAL_COMMUNITY)
Admission: RE | Admit: 2022-12-06 | Discharge: 2022-12-06 | Disposition: A | Discharge status: Home / Self Care | Payer: No Typology Code available for payment source | Attending: Ophthalmology | Admitting: Ophthalmology

## 2022-12-06 DIAGNOSIS — H43813 Vitreous degeneration, bilateral: Secondary | ICD-10-CM

## 2022-12-06 DIAGNOSIS — H43391 Other vitreous opacities, right eye: Secondary | ICD-10-CM | POA: Diagnosis not present

## 2022-12-06 DIAGNOSIS — H43393 Other vitreous opacities, bilateral: Secondary | ICD-10-CM

## 2022-12-06 HISTORY — DX: Fibromyalgia: M79.7

## 2022-12-06 HISTORY — DX: Dry eye syndrome of unspecified lacrimal gland: H04.129

## 2022-12-06 HISTORY — DX: Unspecified osteoarthritis, unspecified site: M19.90

## 2022-12-06 HISTORY — DX: Sleep apnea, unspecified: G47.30

## 2022-12-06 HISTORY — DX: Personal history of urinary calculi: Z87.442

## 2022-12-06 HISTORY — DX: Prediabetes: R73.03

## 2022-12-06 HISTORY — DX: Gastro-esophageal reflux disease without esophagitis: K21.9

## 2022-12-06 HISTORY — DX: Anxiety disorder, unspecified: F41.9

## 2022-12-06 HISTORY — PX: PARS PLANA VITRECTOMY: SHX2166

## 2022-12-06 HISTORY — DX: Headache, unspecified: R51.9

## 2022-12-06 HISTORY — PX: PHOTOCOAGULATION WITH LASER: SHX6027

## 2022-12-06 LAB — CBC
HCT: 43.8 % (ref 39.0–52.0)
Hemoglobin: 14.4 g/dL (ref 13.0–17.0)
MCH: 29.9 pg (ref 26.0–34.0)
MCHC: 32.9 g/dL (ref 30.0–36.0)
MCV: 90.9 fL (ref 80.0–100.0)
Platelets: 193 10*3/uL (ref 150–400)
RBC: 4.82 MIL/uL (ref 4.22–5.81)
RDW: 13.2 % (ref 11.5–15.5)
WBC: 4.1 10*3/uL (ref 4.0–10.5)
nRBC: 0 % (ref 0.0–0.2)

## 2022-12-06 SURGERY — PARS PLANA VITRECTOMY WITH 25 GAUGE
Anesthesia: General | Site: Eye | Laterality: Right

## 2022-12-06 MED ORDER — BUPIVACAINE HCL (PF) 0.75 % IJ SOLN
INTRAMUSCULAR | Status: AC
  Filled 2022-12-06: qty 10

## 2022-12-06 MED ORDER — PROPOFOL 10 MG/ML IV BOLUS
INTRAVENOUS | Status: AC
  Filled 2022-12-06: qty 20

## 2022-12-06 MED ORDER — ATROPINE SULFATE 1 % OP SOLN
OPHTHALMIC | Status: AC
  Filled 2022-12-06: qty 5

## 2022-12-06 MED ORDER — BACITRACIN-POLYMYXIN B 500-10000 UNIT/GM OP OINT
TOPICAL_OINTMENT | OPHTHALMIC | Status: AC
  Filled 2022-12-06: qty 3.5

## 2022-12-06 MED ORDER — BSS IO SOLN
INTRAOCULAR | Status: AC
  Filled 2022-12-06: qty 15

## 2022-12-06 MED ORDER — EPINEPHRINE PF 1 MG/ML IJ SOLN
INTRAOCULAR | Status: DC | PRN
  Administered 2022-12-06: 500.3 mL

## 2022-12-06 MED ORDER — MIDAZOLAM HCL 2 MG/2ML IJ SOLN
INTRAMUSCULAR | Status: DC | PRN
  Administered 2022-12-06: 2 mg via INTRAVENOUS

## 2022-12-06 MED ORDER — TROPICAMIDE 1 % OP SOLN
1.0000 [drp] | OPHTHALMIC | Status: AC | PRN
  Administered 2022-12-06 (×3): 1 [drp] via OPHTHALMIC
  Filled 2022-12-06: qty 15

## 2022-12-06 MED ORDER — FENTANYL CITRATE (PF) 250 MCG/5ML IJ SOLN
INTRAMUSCULAR | Status: DC | PRN
  Administered 2022-12-06: 100 ug via INTRAVENOUS

## 2022-12-06 MED ORDER — BACITRACIN-POLYMYXIN B 500-10000 UNIT/GM OP OINT
TOPICAL_OINTMENT | OPHTHALMIC | Status: DC | PRN
  Administered 2022-12-06: 1 via OPHTHALMIC

## 2022-12-06 MED ORDER — EPINEPHRINE PF 1 MG/ML IJ SOLN
INTRAMUSCULAR | Status: AC
  Filled 2022-12-06: qty 1

## 2022-12-06 MED ORDER — LACTATED RINGERS IV SOLN: INTRAVENOUS | Status: DC

## 2022-12-06 MED ORDER — GATIFLOXACIN 0.5 % OP SOLN
OPHTHALMIC | Status: AC
  Filled 2022-12-06: qty 2.5

## 2022-12-06 MED ORDER — CHLORHEXIDINE GLUCONATE 0.12 % MT SOLN
15.0000 mL | Freq: Once | OROMUCOSAL | Status: AC
  Administered 2022-12-06: 15 mL via OROMUCOSAL
  Filled 2022-12-06: qty 15

## 2022-12-06 MED ORDER — LIDOCAINE 2% (20 MG/ML) 5 ML SYRINGE
INTRAMUSCULAR | Status: DC | PRN
  Administered 2022-12-06: 80 mg via INTRAVENOUS

## 2022-12-06 MED ORDER — PHENYLEPHRINE 80 MCG/ML (10ML) SYRINGE FOR IV PUSH (FOR BLOOD PRESSURE SUPPORT)
PREFILLED_SYRINGE | INTRAVENOUS | Status: DC | PRN
  Administered 2022-12-06 (×2): 160 ug via INTRAVENOUS

## 2022-12-06 MED ORDER — BRIMONIDINE TARTRATE 0.2 % OP SOLN
OPHTHALMIC | Status: AC
  Filled 2022-12-06: qty 5

## 2022-12-06 MED ORDER — TRIAMCINOLONE ACETONIDE 40 MG/ML IJ SUSP
INTRAMUSCULAR | Status: AC
  Filled 2022-12-06: qty 5

## 2022-12-06 MED ORDER — ATROPINE SULFATE 1 % OP SOLN
1.0000 [drp] | OPHTHALMIC | Status: AC | PRN
  Administered 2022-12-06 (×3): 1 [drp] via OPHTHALMIC
  Filled 2022-12-06: qty 2

## 2022-12-06 MED ORDER — FENTANYL CITRATE (PF) 100 MCG/2ML IJ SOLN
25.0000 ug | INTRAMUSCULAR | Status: DC | PRN
  Administered 2022-12-06 (×4): 25 ug via INTRAVENOUS

## 2022-12-06 MED ORDER — PROPOFOL 10 MG/ML IV BOLUS
INTRAVENOUS | Status: DC | PRN
  Administered 2022-12-06: 170 mg via INTRAVENOUS

## 2022-12-06 MED ORDER — POLYMYXIN B SULFATE 500000 UNITS IJ SOLR
INTRAMUSCULAR | Status: AC
  Filled 2022-12-06: qty 10

## 2022-12-06 MED ORDER — ATROPINE SULFATE 1 % OP SOLN
OPHTHALMIC | Status: DC | PRN
  Administered 2022-12-06: 1 [drp] via OPHTHALMIC

## 2022-12-06 MED ORDER — FENTANYL CITRATE (PF) 100 MCG/2ML IJ SOLN
INTRAMUSCULAR | Status: AC
  Filled 2022-12-06: qty 2

## 2022-12-06 MED ORDER — PREDNISOLONE ACETATE 1 % OP SUSP
OPHTHALMIC | Status: AC
  Filled 2022-12-06: qty 5

## 2022-12-06 MED ORDER — DORZOLAMIDE HCL-TIMOLOL MAL 2-0.5 % OP SOLN
OPHTHALMIC | Status: AC
  Filled 2022-12-06: qty 10

## 2022-12-06 MED ORDER — LIDOCAINE HCL 1 % IJ SOLN
INTRAMUSCULAR | Status: AC
  Filled 2022-12-06: qty 20

## 2022-12-06 MED ORDER — SODIUM CHLORIDE (PF) 0.9 % IJ SOLN
INTRAMUSCULAR | Status: AC
  Filled 2022-12-06: qty 10

## 2022-12-06 MED ORDER — ONDANSETRON HCL 4 MG/2ML IJ SOLN
INTRAMUSCULAR | Status: DC | PRN
Start: 2022-12-06 — End: 2022-12-06
  Administered 2022-12-06: 4 mg via INTRAVENOUS

## 2022-12-06 MED ORDER — DORZOLAMIDE HCL-TIMOLOL MAL 2-0.5 % OP SOLN
OPHTHALMIC | Status: DC | PRN
  Administered 2022-12-06: 1 [drp] via OPHTHALMIC

## 2022-12-06 MED ORDER — STERILE WATER FOR INJECTION IJ SOLN
INTRAMUSCULAR | Status: DC | PRN
  Administered 2022-12-06: .5 mL

## 2022-12-06 MED ORDER — PROPARACAINE HCL 0.5 % OP SOLN
1.0000 [drp] | OPHTHALMIC | Status: AC | PRN
  Administered 2022-12-06 (×3): 1 [drp] via OPHTHALMIC
  Filled 2022-12-06: qty 15

## 2022-12-06 MED ORDER — ORAL CARE MOUTH RINSE: 15.0000 mL | Freq: Once | OROMUCOSAL | Status: AC

## 2022-12-06 MED ORDER — CARBACHOL 0.01 % IO SOLN
INTRAOCULAR | Status: AC
  Filled 2022-12-06: qty 1.5

## 2022-12-06 MED ORDER — ROCURONIUM BROMIDE 10 MG/ML (PF) SYRINGE
PREFILLED_SYRINGE | INTRAVENOUS | Status: DC | PRN
  Administered 2022-12-06: 60 mg via INTRAVENOUS
  Administered 2022-12-06: 20 mg via INTRAVENOUS

## 2022-12-06 MED ORDER — BSS IO SOLN
INTRAOCULAR | Status: DC | PRN
  Administered 2022-12-06: 15 mL

## 2022-12-06 MED ORDER — PREDNISOLONE ACETATE 1 % OP SUSP
OPHTHALMIC | Status: DC | PRN
  Administered 2022-12-06: 1 [drp] via OPHTHALMIC

## 2022-12-06 MED ORDER — ACETAMINOPHEN 10 MG/ML IV SOLN: 1000.0000 mg | Freq: Once | INTRAVENOUS | Status: DC | PRN

## 2022-12-06 MED ORDER — DEXAMETHASONE SODIUM PHOSPHATE 10 MG/ML IJ SOLN
INTRAMUSCULAR | Status: AC
  Filled 2022-12-06: qty 1

## 2022-12-06 MED ORDER — SODIUM CHLORIDE 0.9 % IV SOLN: INTRAVENOUS | Status: DC

## 2022-12-06 MED ORDER — NA CHONDROIT SULF-NA HYALURON 40-30 MG/ML IO SOSY
INTRAOCULAR | Status: AC
  Filled 2022-12-06: qty 1

## 2022-12-06 MED ORDER — FENTANYL CITRATE (PF) 250 MCG/5ML IJ SOLN
INTRAMUSCULAR | Status: AC
  Filled 2022-12-06: qty 5

## 2022-12-06 MED ORDER — GATIFLOXACIN 0.5 % OP SOLN
OPHTHALMIC | Status: DC | PRN
  Administered 2022-12-06: 1 [drp] via OPHTHALMIC

## 2022-12-06 MED ORDER — DEXAMETHASONE SODIUM PHOSPHATE 10 MG/ML IJ SOLN
INTRAMUSCULAR | Status: DC | PRN
  Administered 2022-12-06: 10 mg via INTRAVENOUS

## 2022-12-06 MED ORDER — MIDAZOLAM HCL 2 MG/2ML IJ SOLN
INTRAMUSCULAR | Status: AC
  Filled 2022-12-06: qty 2

## 2022-12-06 MED ORDER — BSS PLUS IO SOLN
INTRAOCULAR | Status: AC
  Filled 2022-12-06: qty 500

## 2022-12-06 MED ORDER — BRIMONIDINE TARTRATE 0.2 % OP SOLN
OPHTHALMIC | Status: DC | PRN
  Administered 2022-12-06: 1 [drp] via OPHTHALMIC

## 2022-12-06 MED ORDER — CEFTAZIDIME 1 G IJ SOLR
INTRAMUSCULAR | Status: AC
  Filled 2022-12-06: qty 1

## 2022-12-06 MED ORDER — NA CHONDROIT SULF-NA HYALURON 40-30 MG/ML IO SOSY
INTRAOCULAR | Status: DC | PRN
  Administered 2022-12-06: .5 mL via INTRAOCULAR

## 2022-12-06 MED ORDER — ACETAZOLAMIDE SODIUM 500 MG IJ SOLR
INTRAMUSCULAR | Status: AC
  Filled 2022-12-06: qty 500

## 2022-12-06 MED ORDER — STERILE WATER FOR INJECTION IJ SOLN
INTRAMUSCULAR | Status: AC
  Filled 2022-12-06: qty 10

## 2022-12-06 MED ORDER — TRIAMCINOLONE ACETONIDE 40 MG/ML IJ SUSP
INTRAMUSCULAR | Status: DC | PRN
  Administered 2022-12-06: .4 mL
  Administered 2022-12-06: .5 mL

## 2022-12-06 MED ORDER — PHENYLEPHRINE HCL 10 % OP SOLN
1.0000 [drp] | OPHTHALMIC | Status: AC | PRN
  Administered 2022-12-06 (×3): 1 [drp] via OPHTHALMIC
  Filled 2022-12-06: qty 5

## 2022-12-06 MED ORDER — SUGAMMADEX SODIUM 200 MG/2ML IV SOLN
INTRAVENOUS | Status: DC | PRN
  Administered 2022-12-06: 200 mg via INTRAVENOUS

## 2022-12-06 SURGICAL SUPPLY — 59 items
APL SWBSTK 6 STRL LF DISP (MISCELLANEOUS) ×4
APPLICATOR COTTON TIP 6 STRL (MISCELLANEOUS) ×4 IMPLANT
APPLICATOR COTTON TIP 6IN STRL (MISCELLANEOUS) ×4
BAND WRIST GAS GREEN (MISCELLANEOUS) IMPLANT
BETADINE 5% OPHTHALMIC (OPHTHALMIC) IMPLANT
BNDG EYE OVAL 2 1/8 X 2 5/8 (GAUZE/BANDAGES/DRESSINGS) ×1 IMPLANT
CANNULA ANT CHAM MAIN (OPHTHALMIC RELATED) IMPLANT
CANNULA FLEX TIP 25G (CANNULA) ×1 IMPLANT
CLSR STERI-STRIP ANTIMIC 1/2X4 (GAUZE/BANDAGES/DRESSINGS) ×1 IMPLANT
DRAPE INCISE 51X51 W/FILM STRL (DRAPES) ×1 IMPLANT
DRAPE MICROSCOPE LEICA 46X105 (MISCELLANEOUS) ×1 IMPLANT
DRAPE OPHTHALMIC 77X100 STRL (CUSTOM PROCEDURE TRAY) ×1 IMPLANT
FILTER BLUE MILLIPORE (MISCELLANEOUS) IMPLANT
FILTER STRAW FLUID ASPIR (MISCELLANEOUS) IMPLANT
FORCEPS GRIESHABER MAX 25G (MISCELLANEOUS) IMPLANT
GAS AUTO FILL CONSTEL (OPHTHALMIC)
GAS AUTO FILL CONSTELLATION (OPHTHALMIC) IMPLANT
GLOVE BIO SURGEON STRL SZ7.5 (GLOVE) ×2 IMPLANT
GLOVE BIOGEL M 7.0 STRL (GLOVE) ×1 IMPLANT
GOWN STRL REUS W/ TWL LRG LVL3 (GOWN DISPOSABLE) ×2 IMPLANT
GOWN STRL REUS W/ TWL XL LVL3 (GOWN DISPOSABLE) ×1 IMPLANT
GOWN STRL REUS W/TWL LRG LVL3 (GOWN DISPOSABLE) ×2
GOWN STRL REUS W/TWL XL LVL3 (GOWN DISPOSABLE) ×1
KIT BASIN OR (CUSTOM PROCEDURE TRAY) ×1 IMPLANT
KIT PERFLUORON PROCEDURE 5ML (MISCELLANEOUS) IMPLANT
LENS VITRECTOMY FLAT OCLR DISP (MISCELLANEOUS) IMPLANT
LOOP FINESSE 25 GA (MISCELLANEOUS) IMPLANT
MICROPICK 25G (MISCELLANEOUS)
NDL 18GX1X1/2 (RX/OR ONLY) (NEEDLE) ×2 IMPLANT
NDL 25GX 5/8IN NON SAFETY (NEEDLE) ×1 IMPLANT
NDL FILTER BLUNT 18X1 1/2 (NEEDLE) ×1 IMPLANT
NDL HYPO 30X.5 LL (NEEDLE) ×2 IMPLANT
NEEDLE 18GX1X1/2 (RX/OR ONLY) (NEEDLE) ×2 IMPLANT
NEEDLE 25GX 5/8IN NON SAFETY (NEEDLE) ×1 IMPLANT
NEEDLE FILTER BLUNT 18X1 1/2 (NEEDLE) ×1 IMPLANT
NEEDLE HYPO 30X.5 LL (NEEDLE) ×2 IMPLANT
NS IRRIG 1000ML POUR BTL (IV SOLUTION) ×1 IMPLANT
OPHTHALMIC BETADINE 5% (OPHTHALMIC) ×2
PACK VITRECTOMY CUSTOM (CUSTOM PROCEDURE TRAY) ×1 IMPLANT
PAD ARMBOARD 7.5X6 YLW CONV (MISCELLANEOUS) ×2 IMPLANT
PAK PIK VITRECTOMY CVS 25GA (OPHTHALMIC) ×1 IMPLANT
PENCIL BIPOLAR 25GA STR DISP (OPHTHALMIC RELATED) IMPLANT
PICK MICROPICK 25G (MISCELLANEOUS) IMPLANT
PROBE ENDO DIATHERMY 25G (MISCELLANEOUS) IMPLANT
PROBE LASER ILLUM FLEX CVD 25G (OPHTHALMIC) IMPLANT
REPL STRA BRUSH NDL (NEEDLE) IMPLANT
REPL STRA BRUSH NEEDLE (NEEDLE) IMPLANT
RESERVOIR BACK FLUSH (MISCELLANEOUS) IMPLANT
RETRACTOR IRIS FLEX 25G GRIESH (INSTRUMENTS) IMPLANT
SCISSORS TIP ADVANCED DSP 25GA (INSTRUMENTS) IMPLANT
SET INJECTOR OIL FLUID CONSTEL (OPHTHALMIC) IMPLANT
SHIELD EYE LENSE ONLY DISP (GAUZE/BANDAGES/DRESSINGS) IMPLANT
SUT VICRYL 7 0 TG140 8 (SUTURE) ×1 IMPLANT
SYR 10ML LL (SYRINGE) ×2 IMPLANT
SYR TB 1ML LUER SLIP (SYRINGE) ×2 IMPLANT
TOWEL GREEN STERILE FF (TOWEL DISPOSABLE) ×1 IMPLANT
TRAY FOLEY W/BAG SLVR 14FR (SET/KITS/TRAYS/PACK) IMPLANT
TUBING HIGH PRESS EXTEN 6IN (TUBING) ×1 IMPLANT
WATER STERILE IRR 1000ML POUR (IV SOLUTION) ×1 IMPLANT

## 2022-12-06 NOTE — Brief Op Note (Signed)
12/06/2022  1:12 PM  PATIENT:  Daniel Novak  62 y.o. male  PRE-OPERATIVE DIAGNOSIS:  vitreous condensations, right eye  POST-OPERATIVE DIAGNOSIS:  vitreous condensations, right eye  PROCEDURE:  Procedure(s): PARS PLANA VITRECTOMY WITH 25 GAUGE (Right) PHOTOCOAGULATION WITH LASER (Right)  SURGEON:  Surgeons and Role:    Rennis Chris, MD - Primary  ASSISTANTS: Laurian Brim, Ophthalmic Assistant    ANESTHESIA:   general  EBL:  0 mL   BLOOD ADMINISTERED:none  DRAINS: none   LOCAL MEDICATIONS USED:  NONE  SPECIMEN:  No Specimen  DISPOSITION OF SPECIMEN:  N/A  COUNTS:  YES  TOURNIQUET:  * No tourniquets in log *  DICTATION: .Note written in EPIC  PLAN OF CARE: Discharge to home after PACU  PATIENT DISPOSITION:  PACU - hemodynamically stable.   Delay start of Pharmacological VTE agent (>24hrs) due to surgical blood loss or risk of bleeding: not applicable

## 2022-12-06 NOTE — Transfer of Care (Signed)
Immediate Anesthesia Transfer of Care Note  Patient: Daniel Novak  Procedure(s) Performed: PARS PLANA VITRECTOMY WITH 25 GAUGE (Right: Eye) PHOTOCOAGULATION WITH LASER (Right: Eye)  Patient Location: PACU  Anesthesia Type:General  Level of Consciousness: drowsy and patient cooperative  Airway & Oxygen Therapy: Patient Spontanous Breathing  Post-op Assessment: Report given to RN, Post -op Vital signs reviewed and stable, and Patient moving all extremities X 4  Post vital signs: Reviewed and stable  Last Vitals:  Vitals Value Taken Time  BP 141/90 12/06/22 1316  Temp 36.7 C 12/06/22 1316  Pulse 83 12/06/22 1317  Resp 14 12/06/22 1317  SpO2 99 % 12/06/22 1317  Vitals shown include unfiled device data.  Last Pain:  Vitals:   12/06/22 0925  PainSc: 0-No pain      Patients Stated Pain Goal: 0 (12/06/22 0925)  Complications:  Encounter Notable Events  Notable Event Outcome Phase Comment  Difficult to intubate - expected  Intraprocedure Filed from anesthesia note documentation.

## 2022-12-06 NOTE — Anesthesia Preprocedure Evaluation (Addendum)
Anesthesia Evaluation  Patient identified by MRN, date of birth, ID band Patient awake    Reviewed: Allergy & Precautions, NPO status , Patient's Chart, lab work & pertinent test results  Airway Mallampati: II  TM Distance: >3 FB Neck ROM: Full    Dental no notable dental hx.    Pulmonary sleep apnea    Pulmonary exam normal        Cardiovascular  Rhythm:Regular Rate:Normal     Neuro/Psych  Headaches  Anxiety        GI/Hepatic ,GERD  ,,  Endo/Other    Renal/GU   negative genitourinary   Musculoskeletal  (+) Arthritis ,  Fibromyalgia -  Abdominal Normal abdominal exam  (+)   Peds  Hematology   Anesthesia Other Findings   Reproductive/Obstetrics                             Anesthesia Physical Anesthesia Plan  ASA: 2  Anesthesia Plan: General   Post-op Pain Management:    Induction: Intravenous  PONV Risk Score and Plan: 2 and Ondansetron, Dexamethasone, Midazolam and Treatment may vary due to age or medical condition  Airway Management Planned: Mask and Oral ETT  Additional Equipment: None  Intra-op Plan:   Post-operative Plan: Extubation in OR  Informed Consent: I have reviewed the patients History and Physical, chart, labs and discussed the procedure including the risks, benefits and alternatives for the proposed anesthesia with the patient or authorized representative who has indicated his/her understanding and acceptance.     Dental advisory given  Plan Discussed with: CRNA  Anesthesia Plan Comments:        Anesthesia Quick Evaluation

## 2022-12-06 NOTE — Interval H&P Note (Signed)
History and Physical Interval Note:  12/06/2022 11:09 AM  Daniel Novak  has presented today for surgery, with the diagnosis of vitreous condensations, right eye.  The various methods of treatment have been discussed with the patient and family. After consideration of risks, benefits and other options for treatment, the patient has consented to  Procedure(s): PARS PLANA VITRECTOMY WITH 25 GAUGE (Right) as a surgical intervention.  The patient's history has been reviewed, patient examined, no change in status, stable for surgery.  I have reviewed the patient's chart and labs.  Questions were answered to the patient's satisfaction.     Rennis Chris

## 2022-12-06 NOTE — Discharge Instructions (Signed)
POSTOPERATIVE INSTRUCTIONS  Your doctor has performed vitreoretinal surgery on you at [facility]. [facility].  - Keep eye patched and shielded until seen by Dr. Vanessa Barbara 8 AM tomorrow in clinic - Do not use drops until return - Sleep with head slightly elevated    - No strenuous bending, stooping or lifting.  - You may not drive until further notice.  - Tylenol or any other over-the-counter pain reliever can be used according to your doctor. If more pain medicine is required, your doctor will have a prescription for you.  - You may read, go up and down stairs, and watch television.     Rennis Chris, M.D., Ph.D.

## 2022-12-06 NOTE — Op Note (Signed)
Date of procedure: 08.15.24   Surgeon: Rennis Chris, M.D., Ph.D    Assistant: Laurian Brim, OA   Pre-operative Diagnosis:  1. Visually significant vitreous opacities, RIGHT EYE   Post-operative diagnosis:  1. Visually significant vitreous opacities, RIGHT EYE   Anesthesia: General   Procedure: 1)     25 gauge pars plana vitrectomy w/ endolaser, RIGHT EYE   Complications: none Estimated blood loss: minimal Specimens: none   Brief history:  Pt has a history of visually significant vitreous opacities of the RIGHT eye, which were affecting his activities of daily living. The risks, benefits, and alternatives were explained to the patient, including pain, bleeding, infection, loss of vision, double vision, droopy eyelids, and need for more surgeries.  Informed consent was obtained from the patient and placed in the chart.     Procedure: The patient was brought to the preoperative holding area where the correct eye was confirmed and marked. The patient was then brought to the operating room where general anesthesia was induced by the Anesthesia team. A secondary time-out was performed to identify the correct patient, eye, procedure, and any allergies. The eye was prepped and draped in the usual sterile ophthalmic fashion followed by placement of a lid speculum.    A 25 gauge trocar was placed in the inferotemporal quadrant 3.5 mm posterior to the limbus in a beveled fashion. A 4 mm infusion cannula was placed through this trocar, and the infusion cannula was confirmed in the vitreous cavity with no incarceration of retina or choroid prior to turning it on. Two additional 25 gauge trocars were placed in the superonasal and superotemporal quadrants in a similar beveled fashion. The light pipe and cutter were introduced into the eye and direct vitrectomy was performed to remove the anterior hyaloid.   At this time, a standard three-port pars plana vitrectomy was performed using the light pipe, the  cutter, and the BIOM viewing system. A thorough core and peripheral vitreous dissection was performed. Kenalog was used to highlight the vitreous. Care was taken to insure the posterior vitreous was detached. Peripheral vitrectomy was completed with care using scleral depression. Of note, there were significant vitreous opacities and the vitreous itself was thick and cloudy in character. On scleral depressed exam there was a small horseshoe tear at 0600 and a small retinoschisis cavity vs pocket of SRF without associated retinal break at 0300. Endolaser was used to place laser around the retinal break at 0600, posterior to and around the schisis vs SRF cavity and 360 degrees around the periphery for prophylaxis.   The trocars were then removed and sutured with 7-0 vicryl in an interrupted fashion. Subconjunctival injections of antibiotic and Kenalog were administered. The lid speculum and drapes were removed. Drops of an antibiotic and steroid were given. The eye was patched and shielded. The patient tolerated the procedure well without any intraoperative or immediate postoperative complications. The patient was taken to the recovery room in good condition. The patient was instructed to follow-up with Dr. Vanessa Barbara in clinic on the following morning.

## 2022-12-06 NOTE — Anesthesia Procedure Notes (Signed)
Procedure Name: Intubation Date/Time: 12/06/2022 11:48 AM  Performed by: Alease Medina, CRNAPre-anesthesia Checklist: Patient identified, Emergency Drugs available, Suction available and Patient being monitored Patient Re-evaluated:Patient Re-evaluated prior to induction Oxygen Delivery Method: Circle system utilized Preoxygenation: Pre-oxygenation with 100% oxygen Induction Type: IV induction Ventilation: Oral airway inserted - appropriate to patient size, Two handed mask ventilation required and Mask ventilation with difficulty Laryngoscope Size: Glidescope and 4 Grade View: Grade I Tube type: Oral Tube size: 7.5 mm Number of attempts: 1 Airway Equipment and Method: Oral airway, Rigid stylet and Video-laryngoscopy Placement Confirmation: ETT inserted through vocal cords under direct vision, positive ETCO2 and breath sounds checked- equal and bilateral Secured at: 22 cm Tube secured with: Tape Dental Injury: Teeth and Oropharynx as per pre-operative assessment  Difficulty Due To: Difficulty was anticipated and Difficult Airway- due to anterior larynx Comments: DL x1 with Mac 4 by CRNA Grade 4 view. DL x1 with Miller 3 by MDA Grade 4 view. Glidescope chosen Grade 1 view with S4 blade

## 2022-12-07 ENCOUNTER — Ambulatory Visit (INDEPENDENT_AMBULATORY_CARE_PROVIDER_SITE_OTHER): Payer: No Typology Code available for payment source | Admitting: Ophthalmology

## 2022-12-07 ENCOUNTER — Encounter (INDEPENDENT_AMBULATORY_CARE_PROVIDER_SITE_OTHER): Payer: Self-pay | Admitting: Ophthalmology

## 2022-12-07 DIAGNOSIS — H43813 Vitreous degeneration, bilateral: Secondary | ICD-10-CM | POA: Diagnosis not present

## 2022-12-07 DIAGNOSIS — Z961 Presence of intraocular lens: Secondary | ICD-10-CM

## 2022-12-07 DIAGNOSIS — H43393 Other vitreous opacities, bilateral: Secondary | ICD-10-CM

## 2022-12-07 NOTE — Anesthesia Postprocedure Evaluation (Signed)
Anesthesia Post Note  Patient: RANDEY LANCTOT  Procedure(s) Performed: PARS PLANA VITRECTOMY WITH 25 GAUGE (Right: Eye) PHOTOCOAGULATION WITH LASER (Right: Eye)     Patient location during evaluation: PACU Anesthesia Type: General Level of consciousness: awake and alert Pain management: pain level controlled Vital Signs Assessment: post-procedure vital signs reviewed and stable Respiratory status: spontaneous breathing, nonlabored ventilation, respiratory function stable and patient connected to nasal cannula oxygen Cardiovascular status: blood pressure returned to baseline and stable Postop Assessment: no apparent nausea or vomiting Anesthetic complications: yes   Encounter Notable Events  Notable Event Outcome Phase Comment  Difficult to intubate - expected  Intraprocedure Filed from anesthesia note documentation.    Last Vitals:  Vitals:   12/06/22 1430 12/06/22 1431  BP: 139/82   Pulse: 80 83  Resp: 15 14  Temp:  36.8 C  SpO2: 92% 93%    Last Pain:  Vitals:   12/06/22 0925  PainSc: 0-No pain                 Earl Lites P Tehya Leath

## 2022-12-10 NOTE — Progress Notes (Shared)
[facility] - Clinic Note  12/13/2022   CHIEF COMPLAINT Patient presents for Retina Follow Up  HISTORY OF PRESENT ILLNESS: Daniel Novak is a 62 y.o. male who presents to the clinic today for:  HPI     Retina Follow Up   In left eye.  This started 6 days ago.  Duration of 6 days.  Since onset it is stable.  I, the attending physician,  performed the HPI with the patient and updated documentation appropriately.        Comments   Retina follow up vitreous opacities OS and PPV pt states he is still seeing floaters but denies flashes of light pt denies any vision changes noticed he is using PF Qid ofloxacin  pt has been having a metal taste since using gtts       Last edited by Rennis Chris, MD on 12/13/2022 11:04 AM.    Pt states the last week has been okay, he has had an upset stomach for several days and a metal taste in his mouth, he is seeing little black floaters  Referring physician: Wilfrid Lund, [place] [address] [place],  [place] [address]  HISTORICAL INFORMATION:  Selected notes from the MEDICAL RECORD NUMBER Referred by Dr. Allyne Gee for visually significant vit opacities and PCO OU LEE:  Ocular Hx- PMH-   CURRENT MEDICATIONS: Current Outpatient Medications (Ophthalmic Drugs)  Medication Sig   bacitracin-polymyxin b (POLYSPORIN) ophthalmic ointment Place into the right eye 2 (two) times daily. Place a 1/2 inch ribbon of ointment into the lower eyelid.   prednisoLONE acetate (PRED FORTE) 1 % ophthalmic suspension Place 1 drop into the right eye 3 (three) times daily.   cycloSPORINE (RESTASIS) 0.05 % ophthalmic emulsion Apply 1 drop to eye 2 (two) times daily.   No current facility-administered medications for this visit. (Ophthalmic Drugs)   Current Outpatient Medications (Other)  Medication Sig   ALPRAZolam (XANAX) 0.5 MG tablet Take 0.5 mg by mouth 2 (two) times daily as needed for anxiety.   APPLE CIDER VINEGAR PO Take 1 capsule  by mouth daily.   citalopram (CELEXA) 40 MG tablet Take 40 mg by mouth daily.   famotidine (PEPCID) 40 MG tablet Take 40 mg by mouth daily.   magnesium oxide (MAG-OX) 400 (240 Mg) MG tablet Take 400 mg by mouth daily.   Misc Natural Products (BLOOD SUGAR BALANCE PO) Take 2 tablets by mouth daily. Glucose tablets   Multiple Vitamin (MULTIVITAMIN) tablet Take 1 tablet by mouth daily.   OVER THE COUNTER MEDICATION Take 2 capsules by mouth daily. Super Fruits and Veggies   pravastatin (PRAVACHOL) 10 MG tablet Take 10 mg by mouth at bedtime.   PRESCRIPTION MEDICATION Pt has CPAP machine   sildenafil (REVATIO) 20 MG tablet TAKE 2 TO 5 TABLETS BY MOUTH 30 MINUTES PRIOR TO SEXUAL ACTIVITY   Testosterone (ANDROGEL) 20.25 MG/1.25GM (1.62%) GEL Place 1 Application onto the skin daily.   No current facility-administered medications for this visit. (Other)   REVIEW OF SYSTEMS: ROS   Positive for: Eyes Negative for: Constitutional, Gastrointestinal, Neurological, Skin, Genitourinary, Musculoskeletal, HENT, Endocrine, Cardiovascular, Respiratory, Psychiatric, Allergic/Imm, Heme/Lymph Last edited by Etheleen Mayhew, COT on 12/13/2022  7:57 AM.      ALLERGIES Allergies  Allergen Reactions   Erythromycin Other (See Comments)    Stomach pain    Penicillins Hives   PAST MEDICAL HISTORY Past Medical History:  Diagnosis Date   Anxiety  Arthritis    Dry eye syndrome    post cataract surgery   Fibromyalgia    GERD (gastroesophageal reflux disease)    Headache    History of kidney stones    Pre-diabetes    Sleep apnea    Past Surgical History:  Procedure Laterality Date   CATARACT EXTRACTION Bilateral    CHOLECYSTECTOMY     COLONOSCOPY     HAMMER TOE SURGERY Right    INGUINAL HERNIA REPAIR     unsure which side   KNEE ARTHROSCOPY Right    PARS PLANA VITRECTOMY Right 12/06/2022   Procedure: PARS PLANA VITRECTOMY WITH 25 GAUGE;  Surgeon: Rennis Chris, MD;  Location: [facility] OR;  Service:  Ophthalmology;  Laterality: Right;   PHOTOCOAGULATION WITH LASER Right 12/06/2022   Procedure: PHOTOCOAGULATION WITH LASER;  Surgeon: Rennis Chris, MD;  Location: [facility] OR;  Service: Ophthalmology;  Laterality: Right;   TONSILLECTOMY     FAMILY HISTORY History reviewed. No pertinent family history. SOCIAL HISTORY Social History   Tobacco Use   Smoking status: Never   Smokeless tobacco: Never  Vaping Use   Vaping status: Never Used  Substance Use Topics   Alcohol use: Not Currently   Drug use: Never      OPHTHALMIC EXAM:  Base Eye Exam     Visual Acuity (Snellen - Linear)       Right Left   Dist [place] 20/25 20/20   Dist ph [place] NI          Tonometry (Tonopen, 8:01 AM)       Right Left   Pressure 18 16         Pupils       Pupils Dark Light Shape React APD   Right PERRL 6 6 Round NR None   Left PERRL 3 2 Round Brisk None         Visual Fields       Left Right    Full Full         Extraocular Movement       Right Left    Full, Ortho Full, Ortho         Neuro/Psych     Oriented x3: Yes   Mood/Affect: Normal         Dilation     Both eyes: 2.5% Phenylephrine @ 8:01 AM           Slit Lamp and Fundus Exam     Slit Lamp Exam       Right Left   Lids/Lashes Dermatochalasis - upper lid Dermatochalasis - upper lid   Conjunctiva/Sclera sutures intact White and quiet   Cornea Trace tear film debris, well healed cataract wound Mild tear film debris, well healed cataract wound   Anterior Chamber deep and clear deep and clear   Iris Round and dilated Round and dilated   Lens PC IOL in good position, 1+ Posterior capsular opacification PC IOL in good position   Anterior Vitreous post vitrectomy, punctate cell / pigment Vitreous syneresis, Posterior vitreous detachment, vitreous condensations         Fundus Exam       Right Left   Disc Pink and Sharp Pink and Sharp   C/D Ratio 0.2 0.2   Macula Flat, good foveal reflex, RPE mottling, No heme  or edema Flat, Good foveal reflex, No heme or edema   Vessels mild attenuation, mild tortuosity mild attenuation, mild tortuosity, AV crossing changes   Periphery Attached, no edema,  good 360 peripheral laser Attached           IMAGING AND PROCEDURES  Imaging and Procedures for 12/13/2022  OCT, Retina - OU - Both Eyes       Right Eye Quality was good. Central Foveal Thickness: 264. Progression has been stable. Findings include normal foveal contour, no IRF, no SRF (+vitreous opacities).   Left Eye Quality was good. Central Foveal Thickness: 261. Progression has been stable. Findings include normal foveal contour, no IRF, no SRF.   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 CME - cystoid macular edema. PED - pigment epithelial detachment. IRF - intraretinal fluid. SRF - subretinal fluid. EZ - ellipsoid zone. ERM - epiretinal membrane. ORA - outer retinal atrophy. ORT - outer retinal tubulation. SRHM - subretinal hyper-reflective material. IRHM - intraretinal hyper-reflective material           ASSESSMENT/PLAN:   ICD-10-CM   1. Posterior vitreous detachment of both eyes  H43.813 OCT, Retina - OU - Both Eyes    2. Vitreous opacities of both eyes  H43.393       1,2. 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 Allyne Gee, but ran into insurance issues with the surgery center  - s/p POW1 s/p PPV/EL OD, 08.15.2024             - doing well              - IOP okay at 18             -  PF 4x/day OD -- decrease to TID zymaxid QID OD -- stop on Sunday, 08.25.24 Atropine BID OD -- okay to stop PSO ung QID OD -- decrease to BID              - sleep with head elevated x1 more week             - eye shield when sleeping x1 more week             - post op drop and positioning instructions reviewed              - tylenol/ibuprofen for pain  - f/u 3 weeks, DFE OD, OCT, POV  3. Pseudophakia OU  - s/p CE/IOL OU (Dr. Delaney Meigs)  - IOLs in good position, doing well  - mild PCO OU -- may benefit from YAG laser prior to PPV  - monitor  Ophthalmic Meds Ordered this visit:  Meds ordered this encounter  Medications   prednisoLONE acetate (PRED FORTE) 1 % ophthalmic suspension    Sig: Place 1 drop into the right eye 3 (three) times daily.    Dispense:  15 mL    Refill:  0   bacitracin-polymyxin b (POLYSPORIN) ophthalmic ointment    Sig: Place into the right eye 2 (two) times daily. Place a 1/2 inch ribbon of ointment into the lower eyelid.    Dispense:  3.5 g    Refill:  3     Return in about 3 weeks (around 01/03/2023) for f/u vitreous opacities OU, DFE, OCT.  There are no Patient  Instructions on file for this visit.  Explained the diagnoses, plan, and follow up with the patient and they expressed understanding.  Patient expressed understanding of the importance of proper follow up care.   This document serves as a record of services personally performed by Karie Chimera, MD, PhD. It was created on their behalf by Glee Arvin. Manson Passey, OA an ophthalmic technician. The creation of this record is the provider's dictation and/or activities during the visit.    Electronically signed by: Glee Arvin. Manson Passey, OA 12/13/22 11:08 AM  Karie Chimera, M.D., Ph.D. Diseases & Surgery of the Retina and Vitreous [facility] 12/13/2022  I have reviewed the above documentation for accuracy and completeness, and I agree with the above. Karie Chimera, M.D., Ph.D. 12/13/22 11:08 AM  Abbreviations: M myopia (nearsighted); A astigmatism; H hyperopia (farsighted); P presbyopia; Mrx spectacle prescription;  CTL contact  lenses; OD right eye; OS left eye; OU both eyes  XT exotropia; ET esotropia; PEK punctate epithelial keratitis; PEE punctate epithelial erosions; DES dry eye syndrome; MGD meibomian gland dysfunction; ATs artificial tears; PFAT's preservative free artificial tears; NSC nuclear sclerotic cataract; PSC posterior subcapsular cataract; ERM epi-retinal membrane; PVD posterior vitreous detachment; RD retinal detachment; DM diabetes mellitus; DR diabetic retinopathy; NPDR non-proliferative diabetic retinopathy; PDR proliferative diabetic retinopathy; CSME clinically significant macular edema; DME diabetic macular edema; dbh dot blot hemorrhages; CWS cotton wool spot; POAG primary open angle glaucoma; C/D cup-to-disc ratio; HVF humphrey visual field; GVF goldmann visual field; OCT optical coherence tomography; IOP intraocular pressure; BRVO Branch retinal vein occlusion; CRVO central retinal vein occlusion; CRAO central retinal artery occlusion; BRAO branch retinal artery occlusion; RT retinal tear; SB scleral buckle; PPV pars plana vitrectomy; VH Vitreous hemorrhage; PRP panretinal laser photocoagulation; IVK intravitreal kenalog; VMT vitreomacular traction; MH Macular hole;  NVD neovascularization of the disc; NVE neovascularization elsewhere; AREDS age related eye disease study; ARMD age related macular degeneration; POAG primary open angle glaucoma; EBMD epithelial/anterior basement membrane dystrophy; ACIOL anterior chamber intraocular lens; IOL intraocular lens; PCIOL posterior chamber intraocular lens; Phaco/IOL phacoemulsification with intraocular lens placement; PRK photorefractive keratectomy; LASIK laser assisted in situ keratomileusis; HTN hypertension; DM diabetes mellitus; COPD chronic obstructive pulmonary disease

## 2022-12-13 ENCOUNTER — Encounter (INDEPENDENT_AMBULATORY_CARE_PROVIDER_SITE_OTHER): Payer: Self-pay | Admitting: Ophthalmology

## 2022-12-13 ENCOUNTER — Ambulatory Visit (INDEPENDENT_AMBULATORY_CARE_PROVIDER_SITE_OTHER): Payer: BC Managed Care – PPO | Admitting: Ophthalmology

## 2022-12-13 DIAGNOSIS — H43393 Other vitreous opacities, bilateral: Secondary | ICD-10-CM

## 2022-12-13 DIAGNOSIS — H43813 Vitreous degeneration, bilateral: Secondary | ICD-10-CM

## 2022-12-13 MED ORDER — PREDNISOLONE ACETATE 1 % OP SUSP: 1.0000 [drp] | Freq: Three times a day (TID) | OPHTHALMIC | 0 refills | Status: DC

## 2022-12-13 MED ORDER — BACITRACIN-POLYMYXIN B 500-10000 UNIT/GM OP OINT: TOPICAL_OINTMENT | Freq: Two times a day (BID) | OPHTHALMIC | 3 refills | Status: DC

## 2022-12-20 NOTE — Progress Notes (Signed)
[facility] - Clinic Note  01/03/2023   CHIEF COMPLAINT Patient presents for Retina Follow Up  HISTORY OF PRESENT ILLNESS: Daniel Novak is a 62 y.o. male who presents to the clinic today for:  HPI     Retina Follow Up   Patient presents with  PVD.  In both eyes.  This started 3 weeks ago.  Duration of 3 weeks.  Since onset it is stable.  I, the attending physician,  performed the HPI with the patient and updated documentation appropriately.        Comments   3 week retina follow up PVD s/p PPV OD pt is reporting he is seeing floaters at times just a few and others he is seeing several he is seeing some flashes as well       Last edited by Rennis Chris, MD on 01/04/2023  1:22 AM.     He states that he is still seeing flashes and floaters in the right eye. They vary in size.   Referring physician: Wilfrid Lund, [place] [address] [place],  [place] [address]  HISTORICAL INFORMATION:  Selected notes from the MEDICAL RECORD NUMBER Referred by Dr. Allyne Gee for visually significant vit opacities and PCO OU LEE:  Ocular Hx- PMH-   CURRENT MEDICATIONS: Current Outpatient Medications (Ophthalmic Drugs)  Medication Sig   bacitracin-polymyxin b (POLYSPORIN) ophthalmic ointment Place into the right eye 2 (two) times daily. Place a 1/2 inch ribbon of ointment into the lower eyelid.   cycloSPORINE (RESTASIS) 0.05 % ophthalmic emulsion Apply 1 drop to eye 2 (two) times daily.   prednisoLONE acetate (PRED FORTE) 1 % ophthalmic suspension Place 1 drop into the right eye 3 (three) times daily.   No current facility-administered medications for this visit. (Ophthalmic Drugs)   Current Outpatient Medications (Other)  Medication Sig   ALPRAZolam (XANAX) 0.5 MG tablet Take 0.5 mg by mouth 2 (two) times daily as needed for anxiety.   APPLE CIDER VINEGAR PO Take 1 capsule by mouth daily.   citalopram (CELEXA) 40 MG tablet Take 40 mg by mouth daily.    famotidine (PEPCID) 40 MG tablet Take 40 mg by mouth daily.   magnesium oxide (MAG-OX) 400 (240 Mg) MG tablet Take 400 mg by mouth daily.   Misc Natural Products (BLOOD SUGAR BALANCE PO) Take 2 tablets by mouth daily. Glucose tablets   Multiple Vitamin (MULTIVITAMIN) tablet Take 1 tablet by mouth daily.   OVER THE COUNTER MEDICATION Take 2 capsules by mouth daily. Super Fruits and Veggies   pravastatin (PRAVACHOL) 10 MG tablet Take 10 mg by mouth at bedtime.   PRESCRIPTION MEDICATION Pt has CPAP machine   sildenafil (REVATIO) 20 MG tablet TAKE 2 TO 5 TABLETS BY MOUTH 30 MINUTES PRIOR TO SEXUAL ACTIVITY   Testosterone (ANDROGEL) 20.25 MG/1.25GM (1.62%) GEL Place 1 Application onto the skin daily.   No current facility-administered medications for this visit. (Other)   REVIEW OF SYSTEMS: ROS   Positive for: Eyes Negative for: Constitutional, Gastrointestinal, Neurological, Skin, Genitourinary, Musculoskeletal, HENT, Endocrine, Cardiovascular, Respiratory, Psychiatric, Allergic/Imm, Heme/Lymph Last edited by Etheleen Mayhew, COT on 01/03/2023  8:34 AM.       ALLERGIES Allergies  Allergen Reactions   Erythromycin Other (See Comments)    Stomach pain    Penicillins Hives   PAST MEDICAL HISTORY Past Medical History:  Diagnosis Date   Anxiety    Arthritis    Dry eye syndrome    post  cataract surgery   Fibromyalgia    GERD (gastroesophageal reflux disease)    Headache    History of kidney stones    Pre-diabetes    Sleep apnea    Past Surgical History:  Procedure Laterality Date   CATARACT EXTRACTION Bilateral    CHOLECYSTECTOMY     COLONOSCOPY     HAMMER TOE SURGERY Right    INGUINAL HERNIA REPAIR     unsure which side   KNEE ARTHROSCOPY Right    PARS PLANA VITRECTOMY Right 12/06/2022   Procedure: PARS PLANA VITRECTOMY WITH 25 GAUGE;  Surgeon: Rennis Chris, MD;  Location: [facility] OR;  Service: Ophthalmology;  Laterality: Right;   PHOTOCOAGULATION WITH LASER Right  12/06/2022   Procedure: PHOTOCOAGULATION WITH LASER;  Surgeon: Rennis Chris, MD;  Location: [facility] OR;  Service: Ophthalmology;  Laterality: Right;   TONSILLECTOMY     FAMILY HISTORY History reviewed. No pertinent family history. SOCIAL HISTORY Social History   Tobacco Use   Smoking status: Never   Smokeless tobacco: Never  Vaping Use   Vaping status: Never Used  Substance Use Topics   Alcohol use: Not Currently   Drug use: Never      OPHTHALMIC EXAM:  Base Eye Exam     Visual Acuity (Snellen - Linear)       Right Left   Dist [place] 20/20 -1 20/20         Tonometry (Tonopen, 8:37 AM)       Right Left   Pressure 17 15         Pupils       Pupils Dark Light Shape React APD   Right PERRL 6 6 Round Brisk None   Left PERRL 3 2 Round Brisk None         Visual Fields       Left Right    Full Full         Extraocular Movement       Right Left    Full, Ortho Full, Ortho         Neuro/Psych     Oriented x3: Yes   Mood/Affect: Normal         Dilation     Both eyes: 2.5% Phenylephrine @ 8:38 AM           Slit Lamp and Fundus Exam     Slit Lamp Exam       Right Left   Lids/Lashes Dermatochalasis - upper lid Dermatochalasis - upper lid   Conjunctiva/Sclera sutures intact- disolved, white and quiet White and quiet   Cornea well healed cataract wound, trace PEE inferiorly, mild arcus Mild tear film debris, well healed cataract wound   Anterior Chamber deep and clear deep and clear   Iris Round and dilated Round and dilated   Lens PC IOL in good position, 1+ Posterior capsular opacification approaching center PC IOL in good position   Anterior Vitreous post vitrectomy, trace pigment Vitreous syneresis, Posterior vitreous detachment, vitreous condensations         Fundus Exam       Right Left   Disc Pink and Sharp Pink and Sharp   C/D Ratio 0.2 0.2   Macula Flat, good foveal reflex, RPE mottling, No heme or edema Flat, Good foveal reflex, No  heme or edema   Vessels mild attenuation, mild tortuosity mild attenuation, mild tortuosity, AV crossing changes   Periphery Attached, no edema, good 360 peripheral laser, peripheral schisis cavity 0300 with good laser surrounding  Attached           IMAGING AND PROCEDURES  Imaging and Procedures for 01/03/2023  OCT, Retina - OU - Both Eyes       Right Eye Quality was good. Central Foveal Thickness: 256. Progression has been stable. Findings include normal foveal contour, no IRF, no SRF (+vitreous opacities- improved).   Left Eye Quality was good. Central Foveal Thickness: 260. Progression has been stable. Findings include normal foveal contour, no IRF, no SRF (+vit opacities).   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 CME - cystoid macular edema. PED - pigment epithelial detachment. IRF - intraretinal fluid. SRF - subretinal fluid. EZ - ellipsoid zone. ERM - epiretinal membrane. ORA - outer retinal atrophy. ORT - outer retinal tubulation. SRHM - subretinal hyper-reflective material. IRHM - intraretinal hyper-reflective material            ASSESSMENT/PLAN:   ICD-10-CM   1. Posterior vitreous detachment of both eyes  H43.813 OCT, Retina - OU - Both Eyes    2. Vitreous opacities of both eyes  H43.393     3. Pseudophakia, both eyes  Z96.1       1,2. PVD w/ visually significant vitreous opacities OU  - pt reports debilitating vitreous opacities x 5 years OU (OS > OD) - BCVA 20/20 OU, but pt reports intermittent periods with inability to read due to vitreous opacities / floaters OU - was going to schedule PPV w/ Dr. Allyne Gee, but ran into insurance issues with the surgery center  - s/p POW4 s/p PPV/EL OD, 08.15.2024             - doing well -- reports interval improvement in floaters             - IOP okay at 17  - OCT shows interval improvement  vit opacities             - begin PF taper -- BID OD for 1 week, then once daily for 1 week, then stop - PSO ung PRN             - post op drop and no positioning required             - tylenol/ibuprofen for pain  - f/u 4 weeks, DFE OD, OCT  3. Pseudophakia OU  - s/p CE/IOL OU (Dr. Delaney Meigs)  - IOLs in good position, doing well  - mild PCO OU  - monitor  Ophthalmic Meds Ordered this visit:  No orders of the defined types were placed in this encounter.    Return in about 4 weeks (around 01/31/2023) for f/u PVD OU , DFE, OCT.  There are no Patient Instructions on file for this visit.  Explained the diagnoses, plan, and follow up with the patient and they expressed understanding.  Patient expressed understanding of the importance of proper follow up care.   This document serves as a record of services personally performed by Karie Chimera, MD, PhD. It was created on their behalf by Glee Arvin. Manson Passey, OA an ophthalmic technician. The creation of this record is the provider's dictation and/or activities during the visit.    Electronically signed by: Glee Arvin. Manson Passey, OA 01/04/23 1:23 AM  This document serves as a record of services personally performed by Karie Chimera, MD, PhD. It was created on their behalf by Odie Sera.  Geraci, COT an ophthalmic technician. The creation of this record is the provider's dictation and/or activities during the visit.    Electronically signed by:  Charlette Caffey, COT  01/04/23 1:23 AM   Karie Chimera, M.D., Ph.D. Diseases & Surgery of the Retina and Vitreous [facility] 01/03/2023  I have reviewed the above documentation for accuracy and completeness, and I agree with the above. Karie Chimera, M.D., Ph.D. 01/04/23 1:25 AM   Abbreviations: M myopia (nearsighted); A astigmatism; H hyperopia (farsighted); P presbyopia; Mrx spectacle prescription;  CTL contact lenses; OD right eye; OS left eye; OU both eyes  XT  exotropia; ET esotropia; PEK punctate epithelial keratitis; PEE punctate epithelial erosions; DES dry eye syndrome; MGD meibomian gland dysfunction; ATs artificial tears; PFAT's preservative free artificial tears; NSC nuclear sclerotic cataract; PSC posterior subcapsular cataract; ERM epi-retinal membrane; PVD posterior vitreous detachment; RD retinal detachment; DM diabetes mellitus; DR diabetic retinopathy; NPDR non-proliferative diabetic retinopathy; PDR proliferative diabetic retinopathy; CSME clinically significant macular edema; DME diabetic macular edema; dbh dot blot hemorrhages; CWS cotton wool spot; POAG primary open angle glaucoma; C/D cup-to-disc ratio; HVF humphrey visual field; GVF goldmann visual field; OCT optical coherence tomography; IOP intraocular pressure; BRVO Branch retinal vein occlusion; CRVO central retinal vein occlusion; CRAO central retinal artery occlusion; BRAO branch retinal artery occlusion; RT retinal tear; SB scleral buckle; PPV pars plana vitrectomy; VH Vitreous hemorrhage; PRP panretinal laser photocoagulation; IVK intravitreal kenalog; VMT vitreomacular traction; MH Macular hole;  NVD neovascularization of the disc; NVE neovascularization elsewhere; AREDS age related eye disease study; ARMD age related macular degeneration; POAG primary open angle glaucoma; EBMD epithelial/anterior basement membrane dystrophy; ACIOL anterior chamber intraocular lens; IOL intraocular lens; PCIOL posterior chamber intraocular lens; Phaco/IOL phacoemulsification with intraocular lens placement; PRK photorefractive keratectomy; LASIK laser assisted in situ keratomileusis; HTN hypertension; DM diabetes mellitus; COPD chronic obstructive pulmonary disease

## 2023-01-03 ENCOUNTER — Ambulatory Visit (INDEPENDENT_AMBULATORY_CARE_PROVIDER_SITE_OTHER): Payer: No Typology Code available for payment source | Admitting: Ophthalmology

## 2023-01-03 ENCOUNTER — Encounter (INDEPENDENT_AMBULATORY_CARE_PROVIDER_SITE_OTHER): Payer: Self-pay | Admitting: Ophthalmology

## 2023-01-03 DIAGNOSIS — H43813 Vitreous degeneration, bilateral: Secondary | ICD-10-CM | POA: Diagnosis not present

## 2023-01-03 DIAGNOSIS — H43393 Other vitreous opacities, bilateral: Secondary | ICD-10-CM

## 2023-01-03 DIAGNOSIS — Z961 Presence of intraocular lens: Secondary | ICD-10-CM

## 2023-01-04 ENCOUNTER — Encounter (INDEPENDENT_AMBULATORY_CARE_PROVIDER_SITE_OTHER): Payer: Self-pay | Admitting: Ophthalmology

## 2023-01-31 ENCOUNTER — Encounter (INDEPENDENT_AMBULATORY_CARE_PROVIDER_SITE_OTHER): Payer: No Typology Code available for payment source | Admitting: Ophthalmology

## 2023-01-31 DIAGNOSIS — H43813 Vitreous degeneration, bilateral: Secondary | ICD-10-CM

## 2023-01-31 DIAGNOSIS — Z961 Presence of intraocular lens: Secondary | ICD-10-CM

## 2023-01-31 DIAGNOSIS — H43393 Other vitreous opacities, bilateral: Secondary | ICD-10-CM

## 2023-01-31 NOTE — Progress Notes (Signed)
[facility] - Clinic Note  02/04/2023   CHIEF COMPLAINT Patient presents for Retina Follow Up  HISTORY OF PRESENT ILLNESS: Daniel Novak is a 62 y.o. male who presents to the clinic today for:  HPI     Retina Follow Up   Patient presents with  PVD.  In both eyes.  This started 4 weeks ago.  I, the attending physician,  performed the HPI with the patient and updated documentation appropriately.        Comments   Patient here for 4 weeks retina follow up for PVD OU. Patient states vision seems pretty good. Still sees black dots on and off. OS still has floaters . No eye pain.       Last edited by Rennis Chris, MD on 02/04/2023  3:48 PM.    Pt states vision seems "fine", still sees black dot every "now and then" in the right eye, left eye floaters are still bothering him, but he wants to hold off on surgery in that eye for now  Referring physician: Wilfrid Lund, [place] [address] [place],  [place] [address]  HISTORICAL INFORMATION:  Selected notes from the MEDICAL RECORD NUMBER Referred by Dr. Allyne Gee for visually significant vit opacities and PCO OU LEE:  Ocular Hx- PMH-   CURRENT MEDICATIONS: Current Outpatient Medications (Ophthalmic Drugs)  Medication Sig   bacitracin-polymyxin b (POLYSPORIN) ophthalmic ointment Place into the right eye 2 (two) times daily. Place a 1/2 inch ribbon of ointment into the lower eyelid.   cycloSPORINE (RESTASIS) 0.05 % ophthalmic emulsion Apply 1 drop to eye 2 (two) times daily.   prednisoLONE acetate (PRED FORTE) 1 % ophthalmic suspension Place 1 drop into the right eye 3 (three) times daily.   No current facility-administered medications for this visit. (Ophthalmic Drugs)   Current Outpatient Medications (Other)  Medication Sig   ALPRAZolam (XANAX) 0.5 MG tablet Take 0.5 mg by mouth 2 (two) times daily as needed for anxiety.   APPLE CIDER VINEGAR PO Take 1 capsule by mouth daily.   citalopram (CELEXA)  40 MG tablet Take 40 mg by mouth daily.   famotidine (PEPCID) 40 MG tablet Take 40 mg by mouth daily.   magnesium oxide (MAG-OX) 400 (240 Mg) MG tablet Take 400 mg by mouth daily.   Misc Natural Products (BLOOD SUGAR BALANCE PO) Take 2 tablets by mouth daily. Glucose tablets   Multiple Vitamin (MULTIVITAMIN) tablet Take 1 tablet by mouth daily.   OVER THE COUNTER MEDICATION Take 2 capsules by mouth daily. Super Fruits and Veggies   pravastatin (PRAVACHOL) 10 MG tablet Take 10 mg by mouth at bedtime.   PRESCRIPTION MEDICATION Pt has CPAP machine   sildenafil (REVATIO) 20 MG tablet TAKE 2 TO 5 TABLETS BY MOUTH 30 MINUTES PRIOR TO SEXUAL ACTIVITY   Testosterone (ANDROGEL) 20.25 MG/1.25GM (1.62%) GEL Place 1 Application onto the skin daily.   No current facility-administered medications for this visit. (Other)   REVIEW OF SYSTEMS: ROS   Positive for: Eyes Negative for: Constitutional, Gastrointestinal, Neurological, Skin, Genitourinary, Musculoskeletal, HENT, Endocrine, Cardiovascular, Respiratory, Psychiatric, Allergic/Imm, Heme/Lymph Last edited by Laddie Aquas, COA on 02/04/2023  2:23 PM.     ALLERGIES Allergies  Allergen Reactions   Erythromycin Other (See Comments)    Stomach pain    Penicillins Hives   PAST MEDICAL HISTORY Past Medical History:  Diagnosis Date   Anxiety    Arthritis    Dry eye syndrome  post cataract surgery   Fibromyalgia    GERD (gastroesophageal reflux disease)    Headache    History of kidney stones    Pre-diabetes    Sleep apnea    Past Surgical History:  Procedure Laterality Date   CATARACT EXTRACTION Bilateral    CHOLECYSTECTOMY     COLONOSCOPY     HAMMER TOE SURGERY Right    INGUINAL HERNIA REPAIR     unsure which side   KNEE ARTHROSCOPY Right    PARS PLANA VITRECTOMY Right 12/06/2022   Procedure: PARS PLANA VITRECTOMY WITH 25 GAUGE;  Surgeon: Rennis Chris, MD;  Location: [facility] OR;  Service: Ophthalmology;  Laterality: Right;    PHOTOCOAGULATION WITH LASER Right 12/06/2022   Procedure: PHOTOCOAGULATION WITH LASER;  Surgeon: Rennis Chris, MD;  Location: [facility] OR;  Service: Ophthalmology;  Laterality: Right;   TONSILLECTOMY     FAMILY HISTORY History reviewed. No pertinent family history. SOCIAL HISTORY Social History   Tobacco Use   Smoking status: Never   Smokeless tobacco: Never  Vaping Use   Vaping status: Never Used  Substance Use Topics   Alcohol use: Not Currently   Drug use: Never      OPHTHALMIC EXAM:  Base Eye Exam     Visual Acuity (Snellen - Linear)       Right Left   Dist [place] 20/20 20/20         Tonometry (Tonopen, 2:21 PM)       Right Left   Pressure 20 10         Pupils       Dark Light Shape React APD   Right 4 3 Round Brisk None   Left 3 2 Round Brisk None         Visual Fields (Counting fingers)       Left Right    Full Full         Extraocular Movement       Right Left    Full, Ortho Full, Ortho         Neuro/Psych     Oriented x3: Yes   Mood/Affect: Normal         Dilation     Both eyes: 1.0% Mydriacyl, 2.5% Phenylephrine @ 2:20 PM           Slit Lamp and Fundus Exam     Slit Lamp Exam       Right Left   Lids/Lashes Dermatochalasis - upper lid Dermatochalasis - upper lid   Conjunctiva/Sclera white and quiet White and quiet   Cornea well healed cataract wound, mild arcus, trace tear film debris well healed cataract wound, 1+ inferior Punctate epithelial erosions   Anterior Chamber deep and clear deep and clear   Iris Round and dilated Round and dilated   Lens PC IOL in good position, 1+ Posterior capsular opacification approaching center PC IOL in good position, trace Posterior capsular opacification   Anterior Vitreous post vitrectomy, trace pigment Vitreous syneresis, Posterior vitreous detachment, vitreous condensations         Fundus Exam       Right Left   Disc Pink and Sharp Pink and Sharp   C/D Ratio 0.2 0.2   Macula  Flat, good foveal reflex, RPE mottling, No heme or edema Flat, Good foveal reflex, No heme or edema   Vessels attenuated, mild tortuosity, mild copper wiring mild attenuation, mild tortuosity, AV crossing changes, mild copper wiring   Periphery Attached, no edema, good 360 peripheral  laser, peripheral schisis cavity 0300 with good laser surrounding Attached           IMAGING AND PROCEDURES  Imaging and Procedures for 02/04/2023  OCT, Retina - OU - Both Eyes       Right Eye Quality was good. Central Foveal Thickness: 274. Progression has been stable. Findings include normal foveal contour, no IRF, no SRF (Trace vitreous opacities -- stably improved).   Left Eye Quality was good. Central Foveal Thickness: 260. Progression has been stable. Findings include normal foveal contour, no IRF, no SRF (+vit opacities).   Notes *Images captured and stored on drive  Diagnosis / Impression:  NFP, no IRF/SRF OU OD: Trace vitreous opacities -- stably improved  Clinical management:  See below  Abbreviations: NFP - Normal foveal profile. CME - cystoid macular edema. PED - pigment epithelial detachment. IRF - intraretinal fluid. SRF - subretinal fluid. EZ - ellipsoid zone. ERM - epiretinal membrane. ORA - outer retinal atrophy. ORT - outer retinal tubulation. SRHM - subretinal hyper-reflective material. IRHM - intraretinal hyper-reflective material           ASSESSMENT/PLAN:   ICD-10-CM   1. Posterior vitreous detachment of both eyes  H43.813 OCT, Retina - OU - Both Eyes    2. Vitreous opacities of both eyes  H43.393     3. Pseudophakia, both eyes  Z96.1      1,2. PVD w/ visually significant vitreous opacities OU  - pt reports debilitating vitreous opacities x 5 years OU - BCVA 20/20 OU, but pt reports intermittent periods with inability to read due to vitreous opacities / floaters OU - was going to schedule PPV w/ Dr. Allyne Gee, but ran into insurance issues with the surgery center  -  s/p PPV/EL OD, 08.15.2024             - doing well -- reports interval improvement in floaters             - IOP okay at 17  - OCT shows trace vitreous opacities -- stably improved OD  - off all drops  - BCVA 20/20 OD  - f/u 3 months, DFE/OCT -- pt considering PPV OS  3. Pseudophakia OU  - s/p CE/IOL OU (Dr. Delaney Meigs)  - IOLs in good position, doing well  - mild PCO OU  - monitor  Ophthalmic Meds Ordered this visit:  No orders of the defined types were placed in this encounter.    Return in about 3 months (around 05/07/2023) for f/u PVD OU, DFE, OCT.  There are no Patient Instructions on file for this visit.  Explained the diagnoses, plan, and follow up with the patient and they expressed understanding.  Patient expressed understanding of the importance of proper follow up care.   This document serves as a record of services personally performed by Karie Chimera, MD, PhD. It was created on their behalf by Annalee Genta, COMT. The creation of this record is the provider's dictation and/or activities during the visit.  Electronically signed by: Annalee Genta, COMT 02/04/23 3:48 PM  This document serves as a record of services personally performed by Karie Chimera, MD, PhD. It was created on their behalf by Glee Arvin. Manson Passey, OA an ophthalmic technician. The creation of this record is the provider's dictation and/or activities during the visit.    Electronically signed by: Glee Arvin. Manson Passey, OA 02/04/23 3:48 PM  Karie Chimera, M.D., Ph.D. Diseases & Surgery of the Retina and Vitreous [facility]  I have reviewed the above documentation for accuracy and completeness, and I agree with the above. Karie Chimera, M.D., Ph.D. 02/04/23 3:50 PM   Abbreviations: M myopia (nearsighted); A astigmatism; H hyperopia (farsighted); P presbyopia; Mrx spectacle prescription;  CTL contact lenses; OD right eye; OS left eye; OU both eyes  XT exotropia; ET esotropia; PEK  punctate epithelial keratitis; PEE punctate epithelial erosions; DES dry eye syndrome; MGD meibomian gland dysfunction; ATs artificial tears; PFAT's preservative free artificial tears; NSC nuclear sclerotic cataract; PSC posterior subcapsular cataract; ERM epi-retinal membrane; PVD posterior vitreous detachment; RD retinal detachment; DM diabetes mellitus; DR diabetic retinopathy; NPDR non-proliferative diabetic retinopathy; PDR proliferative diabetic retinopathy; CSME clinically significant macular edema; DME diabetic macular edema; dbh dot blot hemorrhages; CWS cotton wool spot; POAG primary open angle glaucoma; C/D cup-to-disc ratio; HVF humphrey visual field; GVF goldmann visual field; OCT optical coherence tomography; IOP intraocular pressure; BRVO Branch retinal vein occlusion; CRVO central retinal vein occlusion; CRAO central retinal artery occlusion; BRAO branch retinal artery occlusion; RT retinal tear; SB scleral buckle; PPV pars plana vitrectomy; VH Vitreous hemorrhage; PRP panretinal laser photocoagulation; IVK intravitreal kenalog; VMT vitreomacular traction; MH Macular hole;  NVD neovascularization of the disc; NVE neovascularization elsewhere; AREDS age related eye disease study; ARMD age related macular degeneration; POAG primary open angle glaucoma; EBMD epithelial/anterior basement membrane dystrophy; ACIOL anterior chamber intraocular lens; IOL intraocular lens; PCIOL posterior chamber intraocular lens; Phaco/IOL phacoemulsification with intraocular lens placement; PRK photorefractive keratectomy; LASIK laser assisted in situ keratomileusis; HTN hypertension; DM diabetes mellitus; COPD chronic obstructive pulmonary disease

## 2023-02-04 ENCOUNTER — Encounter (INDEPENDENT_AMBULATORY_CARE_PROVIDER_SITE_OTHER): Payer: Self-pay | Admitting: Ophthalmology

## 2023-02-04 ENCOUNTER — Ambulatory Visit (INDEPENDENT_AMBULATORY_CARE_PROVIDER_SITE_OTHER): Payer: No Typology Code available for payment source | Admitting: Ophthalmology

## 2023-02-04 DIAGNOSIS — Z961 Presence of intraocular lens: Secondary | ICD-10-CM

## 2023-02-04 DIAGNOSIS — H43813 Vitreous degeneration, bilateral: Secondary | ICD-10-CM

## 2023-02-04 DIAGNOSIS — H43393 Other vitreous opacities, bilateral: Secondary | ICD-10-CM

## 2023-04-26 NOTE — Progress Notes (Signed)
 [facility] - Clinic Note  05/06/2023   CHIEF COMPLAINT Patient presents for Retina Follow Up  HISTORY OF PRESENT ILLNESS: Daniel Novak is a 63 y.o. male who presents to the clinic today for:  HPI     Retina Follow Up   Patient presents with  PVD.  In both eyes.  This started 3 months ago.  I, the attending physician,  performed the HPI with the patient and updated documentation appropriately.        Comments   Patient here for 3 months retina follow up for PVD OU. Patient states vision doing ok. Wants to have surgery OS. Has floaters. No eye pain. Has a flash OD on an occasion temporally.      Last edited by Valdemar Rogue, MD on 05/06/2023 10:18 PM.     Pt states he sees a small black dot every once in awhile in the right eye, he sees a lot of floaters in his left eye, pt is considering PPV for floaters in the left eye in March  Referring physician: Alben Therisa MATSU, [place] [address] [place],  [place] [address]  HISTORICAL INFORMATION:  Selected notes from the MEDICAL RECORD NUMBER Referred by Dr. Jarold for visually significant vit opacities and PCO OU LEE:  Ocular Hx- PMH-   CURRENT MEDICATIONS: Current Outpatient Medications (Ophthalmic Drugs)  Medication Sig   cycloSPORINE (RESTASIS) 0.05 % ophthalmic emulsion Apply 1 drop to eye 2 (two) times daily.   bacitracin -polymyxin b  (POLYSPORIN ) ophthalmic ointment Place into the right eye 2 (two) times daily. Place a 1/2 inch ribbon of ointment into the lower eyelid.   prednisoLONE  acetate (PRED FORTE ) 1 % ophthalmic suspension Place 1 drop into the right eye 3 (three) times daily.   No current facility-administered medications for this visit. (Ophthalmic Drugs)   Current Outpatient Medications (Other)  Medication Sig   ALPRAZolam (XANAX) 0.5 MG tablet Take 0.5 mg by mouth 2 (two) times daily as needed for anxiety.   APPLE CIDER VINEGAR PO Take 1 capsule by mouth daily.   citalopram  (CELEXA) 40 MG tablet Take 40 mg by mouth daily.   famotidine (PEPCID) 40 MG tablet Take 40 mg by mouth daily.   magnesium oxide (MAG-OX) 400 (240 Mg) MG tablet Take 400 mg by mouth daily.   Misc Natural Products (BLOOD SUGAR BALANCE PO) Take 2 tablets by mouth daily. Glucose tablets   Multiple Vitamin (MULTIVITAMIN) tablet Take 1 tablet by mouth daily.   OVER THE COUNTER MEDICATION Take 2 capsules by mouth daily. Super Fruits and Veggies   pravastatin (PRAVACHOL) 10 MG tablet Take 10 mg by mouth at bedtime.   PRESCRIPTION MEDICATION Pt has CPAP machine   sildenafil (REVATIO) 20 MG tablet TAKE 2 TO 5 TABLETS BY MOUTH 30 MINUTES PRIOR TO SEXUAL ACTIVITY   Testosterone  (ANDROGEL ) 20.25 MG/1.25GM (1.62%) GEL Place 1 Application onto the skin daily.   No current facility-administered medications for this visit. (Other)   REVIEW OF SYSTEMS: ROS   Positive for: Eyes Negative for: Constitutional, Gastrointestinal, Neurological, Skin, Genitourinary, Musculoskeletal, HENT, Endocrine, Cardiovascular, Respiratory, Psychiatric, Allergic/Imm, Heme/Lymph Last edited by Orval Asberry RAMAN, COA on 05/06/2023  2:47 PM.      ALLERGIES Allergies  Allergen Reactions   Erythromycin Other (See Comments)    Stomach pain    Penicillins Hives   PAST MEDICAL HISTORY Past Medical History:  Diagnosis Date   Anxiety    Arthritis    Dry eye  syndrome    post cataract surgery   Fibromyalgia    GERD (gastroesophageal reflux disease)    Headache    History of kidney stones    Pre-diabetes    Sleep apnea    Past Surgical History:  Procedure Laterality Date   CATARACT EXTRACTION Bilateral    CHOLECYSTECTOMY     COLONOSCOPY     HAMMER TOE SURGERY Right    INGUINAL HERNIA REPAIR     unsure which side   KNEE ARTHROSCOPY Right    PARS PLANA VITRECTOMY Right 12/06/2022   Procedure: PARS PLANA VITRECTOMY WITH 25 GAUGE;  Surgeon: Valdemar Rogue, MD;  Location[facility] OR;  Service: Ophthalmology;  Laterality: Right;    PHOTOCOAGULATION WITH LASER Right 12/06/2022   Procedure: PHOTOCOAGULATION WITH LASER;  Surgeon: Valdemar Rogue, MD;  Location: [facility] OR;  Service: Ophthalmology;  Laterality: Right;   TONSILLECTOMY     FAMILY HISTORY History reviewed. No pertinent family history. SOCIAL HISTORY Social History   Tobacco Use   Smoking status: Never   Smokeless tobacco: Never  Vaping Use   Vaping status: Never Used  Substance Use Topics   Alcohol use: Not Currently   Drug use: Never      OPHTHALMIC EXAM:  Base Eye Exam     Visual Acuity (Snellen - Linear)       Right Left   Dist [place] 20/20 20/20         Tonometry (Tonopen, 2:44 PM)       Right Left   Pressure 22 10         Pupils       Dark Light Shape React APD   Right 4 3 Round Brisk None   Left 3 2 Round Brisk None         Visual Fields (Counting fingers)       Left Right    Full Full         Extraocular Movement       Right Left    Full, Ortho Full, Ortho         Neuro/Psych     Oriented x3: Yes   Mood/Affect: Normal         Dilation     Both eyes: 1.0% Mydriacyl , 2.5% Phenylephrine  @ 2:44 PM           Slit Lamp and Fundus Exam     Slit Lamp Exam       Right Left   Lids/Lashes Dermatochalasis - upper lid Dermatochalasis - upper lid   Conjunctiva/Sclera white and quiet White and quiet   Cornea well healed cataract wound, mild arcus, trace tear film debris well healed cataract wound, trace Punctate epithelial erosions, trace tear film debris   Anterior Chamber deep and clear deep and clear   Iris Round and dilated Round and dilated   Lens PC IOL in good position, 1+ Posterior capsular opacification approaching center PC IOL in good position, trace Posterior capsular opacification   Anterior Vitreous post vitrectomy, trace pigment in anterior vit mild syneresis, Posterior vitreous detachment, vitreous condensations         Fundus Exam       Right Left   Disc Pink and Sharp, Compact Pink  and Sharp   C/D Ratio 0.2 0.2   Macula Flat, good foveal reflex, RPE mottling, No heme or edema Flat, Good foveal reflex, No heme or edema   Vessels attenuated, mild tortuosity, mild copper wiring mild attenuation, mild tortuosity, AV crossing changes, mild  copper wiring   Periphery Attached, no edema, good 360 peripheral laser, peripheral schisis cavity 0300 with good laser surrounding Attached           IMAGING AND PROCEDURES  Imaging and Procedures for 05/06/2023  OCT, Retina - OU - Both Eyes       Right Eye Quality was good. Central Foveal Thickness: 276. Progression has been stable. Findings include normal foveal contour, no IRF, no SRF (Trace vitreous opacities -- stably improved).   Left Eye Quality was good. Central Foveal Thickness: 261. Progression has been stable. Findings include normal foveal contour, no IRF, no SRF (+vit opacities).   Notes *Images captured and stored on drive  Diagnosis / Impression:  NFP, no IRF/SRF OU OD: Trace vitreous opacities -- stably improved  Clinical management:  See below  Abbreviations: NFP - Normal foveal profile. CME - cystoid macular edema. PED - pigment epithelial detachment. IRF - intraretinal fluid. SRF - subretinal fluid. EZ - ellipsoid zone. ERM - epiretinal membrane. ORA - outer retinal atrophy. ORT - outer retinal tubulation. SRHM - subretinal hyper-reflective material. IRHM - intraretinal hyper-reflective material           ASSESSMENT/PLAN:   ICD-10-CM   1. Posterior vitreous detachment of both eyes  H43.813 OCT, Retina - OU - Both Eyes    2. Vitreous opacities of both eyes  H43.393     3. Pseudophakia, both eyes  Z96.1      1,2. PVD w/ visually significant vitreous opacities OU  - pt reports debilitating vitreous opacities x 5 years OU - BCVA 20/20 OU, but pt reports intermittent periods with inability to read due to vitreous opacities / floaters OU - was going to schedule PPV w/ Dr. Jarold, but ran into  insurance issues with the surgery center  - s/p PPV/EL OD, 08.15.2024             - doing well -- reports interval improvement in floaters OD, but floaters OS remain bothersome             - IOP okay at 17  - OCT shows trace vitreous opacities -- stably improved OD  - off all drops  - BCVA 20/20 OD  - pt wishes to proceed with PPV OS in March 2025  - f/u 6 wks, DFE/OCT, surgical planning for OS  3. Pseudophakia OU  - s/p CE/IOL OU (Dr. Meridee)  - IOLs in good position, doing well  - mild PCO OU  - monitor  Ophthalmic Meds Ordered this visit:  No orders of the defined types were placed in this encounter.    Return in about 6 weeks (around 06/17/2023) for f/u PVD OU, DFE, OCT.  There are no Patient Instructions on file for this visit.  Explained the diagnoses, plan, and follow up with the patient and they expressed understanding.  Patient expressed understanding of the importance of proper follow up care.   This document serves as a record of services personally performed by Redell JUDITHANN Hans, MD, PhD. It was created on their behalf by Alan PARAS. Delores, OA an ophthalmic technician. The creation of this record is the provider's dictation and/or activities during the visit.    Electronically signed by: Alan PARAS. Delores, OA 05/06/23 10:20 PM  Redell JUDITHANN Hans, M.D., Ph.D. Diseases & Surgery of the Retina and Vitreous [facility]  I have reviewed the above documentation for accuracy and completeness, and I agree with the above. Redell JUDITHANN Hans, M.D.,  Ph.D. 05/06/23 10:24 PM   Abbreviations: M myopia (nearsighted); A astigmatism; H hyperopia (farsighted); P presbyopia; Mrx spectacle prescription;  CTL contact lenses; OD right eye; OS left eye; OU both eyes  XT exotropia; ET esotropia; PEK punctate epithelial keratitis; PEE punctate epithelial erosions; DES dry eye syndrome; MGD meibomian gland dysfunction; ATs artificial tears; PFAT's preservative free artificial  tears; NSC nuclear sclerotic cataract; PSC posterior subcapsular cataract; ERM epi-retinal membrane; PVD posterior vitreous detachment; RD retinal detachment; DM diabetes mellitus; DR diabetic retinopathy; NPDR non-proliferative diabetic retinopathy; PDR proliferative diabetic retinopathy; CSME clinically significant macular edema; DME diabetic macular edema; dbh dot blot hemorrhages; CWS cotton wool spot; POAG primary open angle glaucoma; C/D cup-to-disc ratio; HVF humphrey visual field; GVF goldmann visual field; OCT optical coherence tomography; IOP intraocular pressure; BRVO Branch retinal vein occlusion; CRVO central retinal vein occlusion; CRAO central retinal artery occlusion; BRAO branch retinal artery occlusion; RT retinal tear; SB scleral buckle; PPV pars plana vitrectomy; VH Vitreous hemorrhage; PRP panretinal laser photocoagulation; IVK intravitreal kenalog ; VMT vitreomacular traction; MH Macular hole;  NVD neovascularization of the disc; NVE neovascularization elsewhere; AREDS age related eye disease study; ARMD age related macular degeneration; POAG primary open angle glaucoma; EBMD epithelial/anterior basement membrane dystrophy; ACIOL anterior chamber intraocular lens; IOL intraocular lens; PCIOL posterior chamber intraocular lens; Phaco/IOL phacoemulsification with intraocular lens placement; PRK photorefractive keratectomy; LASIK laser assisted in situ keratomileusis; HTN hypertension; DM diabetes mellitus; COPD chronic obstructive pulmonary disease

## 2023-05-06 ENCOUNTER — Encounter (INDEPENDENT_AMBULATORY_CARE_PROVIDER_SITE_OTHER): Payer: Self-pay | Admitting: Ophthalmology

## 2023-05-06 ENCOUNTER — Ambulatory Visit (INDEPENDENT_AMBULATORY_CARE_PROVIDER_SITE_OTHER): Payer: No Typology Code available for payment source | Admitting: Ophthalmology

## 2023-05-06 DIAGNOSIS — Z961 Presence of intraocular lens: Secondary | ICD-10-CM | POA: Diagnosis not present

## 2023-05-06 DIAGNOSIS — H43813 Vitreous degeneration, bilateral: Secondary | ICD-10-CM | POA: Diagnosis not present

## 2023-05-06 DIAGNOSIS — H43393 Other vitreous opacities, bilateral: Secondary | ICD-10-CM | POA: Diagnosis not present

## 2023-06-13 NOTE — Progress Notes (Shared)
[facility] - Clinic Note  06/17/2023   CHIEF COMPLAINT Patient presents for No chief complaint on file.  HISTORY OF PRESENT ILLNESS: Daniel Novak is a 63 y.o. male who presents to the clinic today for:    Pt states he sees a small black dot every once in awhile in the right eye, he sees a lot of floaters in his left eye, pt is considering PPV for floaters in the left eye in March  Referring physician: Wilfrid Lund, [place] [address] [place],  [place] [address]  HISTORICAL INFORMATION:  Selected notes from the MEDICAL RECORD NUMBER Referred by Dr. Allyne Gee for visually significant vit opacities and PCO OU LEE:  Ocular Hx- PMH-   CURRENT MEDICATIONS: Current Outpatient Medications (Ophthalmic Drugs)  Medication Sig   bacitracin-polymyxin b (POLYSPORIN) ophthalmic ointment Place into the right eye 2 (two) times daily. Place a 1/2 inch ribbon of ointment into the lower eyelid.   cycloSPORINE (RESTASIS) 0.05 % ophthalmic emulsion Apply 1 drop to eye 2 (two) times daily.   prednisoLONE acetate (PRED FORTE) 1 % ophthalmic suspension Place 1 drop into the right eye 3 (three) times daily.   No current facility-administered medications for this visit. (Ophthalmic Drugs)   Current Outpatient Medications (Other)  Medication Sig   ALPRAZolam (XANAX) 0.5 MG tablet Take 0.5 mg by mouth 2 (two) times daily as needed for anxiety.   APPLE CIDER VINEGAR PO Take 1 capsule by mouth daily.   citalopram (CELEXA) 40 MG tablet Take 40 mg by mouth daily.   famotidine (PEPCID) 40 MG tablet Take 40 mg by mouth daily.   magnesium oxide (MAG-OX) 400 (240 Mg) MG tablet Take 400 mg by mouth daily.   Misc Natural Products (BLOOD SUGAR BALANCE PO) Take 2 tablets by mouth daily. Glucose tablets   Multiple Vitamin (MULTIVITAMIN) tablet Take 1 tablet by mouth daily.   OVER THE COUNTER MEDICATION Take 2 capsules by mouth daily. Super Fruits and Veggies   pravastatin (PRAVACHOL)  10 MG tablet Take 10 mg by mouth at bedtime.   PRESCRIPTION MEDICATION Pt has CPAP machine   sildenafil (REVATIO) 20 MG tablet TAKE 2 TO 5 TABLETS BY MOUTH 30 MINUTES PRIOR TO SEXUAL ACTIVITY   Testosterone (ANDROGEL) 20.25 MG/1.25GM (1.62%) GEL Place 1 Application onto the skin daily.   No current facility-administered medications for this visit. (Other)   REVIEW OF SYSTEMS:    ALLERGIES Allergies  Allergen Reactions   Erythromycin Other (See Comments)    Stomach pain    Penicillins Hives   PAST MEDICAL HISTORY Past Medical History:  Diagnosis Date   Anxiety    Arthritis    Dry eye syndrome    post cataract surgery   Fibromyalgia    GERD (gastroesophageal reflux disease)    Headache    History of kidney stones    Pre-diabetes    Sleep apnea    Past Surgical History:  Procedure Laterality Date   CATARACT EXTRACTION Bilateral    CHOLECYSTECTOMY     COLONOSCOPY     HAMMER TOE SURGERY Right    INGUINAL HERNIA REPAIR     unsure which side   KNEE ARTHROSCOPY Right    PARS PLANA VITRECTOMY Right 12/06/2022   Procedure: PARS PLANA VITRECTOMY WITH 25 GAUGE;  Surgeon: Rennis Chris, MD;  Location: [facility] OR;  Service: Ophthalmology;  Laterality: Right;   PHOTOCOAGULATION WITH LASER Right 12/06/2022   Procedure: PHOTOCOAGULATION WITH LASER;  Surgeon: Vanessa Barbara,  Arlys John, MD;  Location: [facility] OR;  Service: Ophthalmology;  Laterality: Right;   TONSILLECTOMY     FAMILY HISTORY No family history on file. SOCIAL HISTORY Social History   Tobacco Use   Smoking status: Never   Smokeless tobacco: Never  Vaping Use   Vaping status: Never Used  Substance Use Topics   Alcohol use: Not Currently   Drug use: Never      OPHTHALMIC EXAM:  Not recorded    IMAGING AND PROCEDURES  Imaging and Procedures for 06/17/2023         ASSESSMENT/PLAN:   ICD-10-CM   1. Posterior vitreous detachment of both eyes  H43.813     2. Vitreous opacities of both eyes  H43.393     3. Pseudophakia,  both eyes  Z96.1      1,2. PVD w/ visually significant vitreous opacities OU  - pt reports debilitating vitreous opacities x 5 years OU - BCVA 20/20 OU, but pt reports intermittent periods with inability to read due to vitreous opacities / floaters OU - was going to schedule PPV w/ Dr. Allyne Gee, but ran into insurance issues with the surgery center  - s/p PPV/EL OD, 08.15.2024             - doing well -- reports interval improvement in floaters OD, but floaters OS remain bothersome             - IOP okay at 17  - OCT shows trace vitreous opacities -- stably improved OD  - off all drops  - BCVA 20/20 OD  - pt wishes to proceed with PPV OS in March 2025  - f/u 6 wks, DFE/OCT, surgical planning for OS  3. Pseudophakia OU  - s/p CE/IOL OU (Dr. Delaney Meigs)  - IOLs in good position, doing well  - mild PCO OU  - monitor  Ophthalmic Meds Ordered this visit:  No orders of the defined types were placed in this encounter.    No follow-ups on file.  There are no Patient Instructions on file for this visit.  Explained the diagnoses, plan, and follow up with the patient and they expressed understanding.  Patient expressed understanding of the importance of proper follow up care.   This document serves as a record of services personally performed by Karie Chimera, MD, PhD. It was created on their behalf by Glee Arvin. Manson Passey, OA an ophthalmic technician. The creation of this record is the provider's dictation and/or activities during the visit.    Electronically signed by: Glee Arvin. Manson Passey, OA 06/13/23 9:46 AM   Karie Chimera, M.D., Ph.D. Diseases & Surgery of the Retina and Vitreous [facility]     Abbreviations: M myopia (nearsighted); A astigmatism; H hyperopia (farsighted); P presbyopia; Mrx spectacle prescription;  CTL contact lenses; OD right eye; OS left eye; OU both eyes  XT exotropia; ET esotropia; PEK punctate epithelial keratitis; PEE punctate  epithelial erosions; DES dry eye syndrome; MGD meibomian gland dysfunction; ATs artificial tears; PFAT's preservative free artificial tears; NSC nuclear sclerotic cataract; PSC posterior subcapsular cataract; ERM epi-retinal membrane; PVD posterior vitreous detachment; RD retinal detachment; DM diabetes mellitus; DR diabetic retinopathy; NPDR non-proliferative diabetic retinopathy; PDR proliferative diabetic retinopathy; CSME clinically significant macular edema; DME diabetic macular edema; dbh dot blot hemorrhages; CWS cotton wool spot; POAG primary open angle glaucoma; C/D cup-to-disc ratio; HVF humphrey visual field; GVF goldmann visual field; OCT optical coherence tomography; IOP intraocular pressure; BRVO Branch retinal vein occlusion; CRVO  central retinal vein occlusion; CRAO central retinal artery occlusion; BRAO branch retinal artery occlusion; RT retinal tear; SB scleral buckle; PPV pars plana vitrectomy; VH Vitreous hemorrhage; PRP panretinal laser photocoagulation; IVK intravitreal kenalog; VMT vitreomacular traction; MH Macular hole;  NVD neovascularization of the disc; NVE neovascularization elsewhere; AREDS age related eye disease study; ARMD age related macular degeneration; POAG primary open angle glaucoma; EBMD epithelial/anterior basement membrane dystrophy; ACIOL anterior chamber intraocular lens; IOL intraocular lens; PCIOL posterior chamber intraocular lens; Phaco/IOL phacoemulsification with intraocular lens placement; PRK photorefractive keratectomy; LASIK laser assisted in situ keratomileusis; HTN hypertension; DM diabetes mellitus; COPD chronic obstructive pulmonary disease

## 2023-06-17 ENCOUNTER — Encounter (INDEPENDENT_AMBULATORY_CARE_PROVIDER_SITE_OTHER): Payer: No Typology Code available for payment source | Admitting: Ophthalmology

## 2023-06-17 DIAGNOSIS — Z961 Presence of intraocular lens: Secondary | ICD-10-CM

## 2023-06-17 DIAGNOSIS — H43813 Vitreous degeneration, bilateral: Secondary | ICD-10-CM

## 2023-06-17 DIAGNOSIS — H43393 Other vitreous opacities, bilateral: Secondary | ICD-10-CM

## 2023-11-26 ENCOUNTER — Emergency Department (HOSPITAL_BASED_OUTPATIENT_CLINIC_OR_DEPARTMENT_OTHER)

## 2023-11-26 ENCOUNTER — Emergency Department (HOSPITAL_BASED_OUTPATIENT_CLINIC_OR_DEPARTMENT_OTHER)
Admission: EM | Admit: 2023-11-26 | Discharge: 2023-11-26 | Disposition: A | Discharge status: Home / Self Care | Source: Ambulatory Visit | Attending: Emergency Medicine | Admitting: Emergency Medicine

## 2023-11-26 ENCOUNTER — Encounter (HOSPITAL_BASED_OUTPATIENT_CLINIC_OR_DEPARTMENT_OTHER): Payer: Self-pay | Admitting: Emergency Medicine

## 2023-11-26 ENCOUNTER — Other Ambulatory Visit: Payer: Self-pay

## 2023-11-26 DIAGNOSIS — R002 Palpitations: Secondary | ICD-10-CM | POA: Diagnosis present

## 2023-11-26 DIAGNOSIS — I493 Ventricular premature depolarization: Secondary | ICD-10-CM | POA: Insufficient documentation

## 2023-11-26 LAB — MAGNESIUM: Magnesium: 2.2 mg/dL (ref 1.7–2.4)

## 2023-11-26 LAB — BASIC METABOLIC PANEL WITH GFR
Anion gap: 11 (ref 5–15)
BUN: 16 mg/dL (ref 8–23)
CO2: 27 mmol/L (ref 22–32)
Calcium: 9.2 mg/dL (ref 8.9–10.3)
Chloride: 103 mmol/L (ref 98–111)
Creatinine, Ser: 1.01 mg/dL (ref 0.61–1.24)
GFR, Estimated: >60 mL/min (ref >60)
Glucose, Bld: 122 mg/dL — ABNORMAL HIGH (ref 70–99)
Potassium: 3.8 mmol/L (ref 3.5–5.1)
Sodium: 141 mmol/L (ref 135–145)

## 2023-11-26 LAB — CBC
HCT: 42 % (ref 39.0–52.0)
Hemoglobin: 14.3 g/dL (ref 13.0–17.0)
MCH: 30.4 pg (ref 26.0–34.0)
MCHC: 34 g/dL (ref 30.0–36.0)
MCV: 89.4 fL (ref 80.0–100.0)
Platelets: 175 K/uL (ref 150–400)
RBC: 4.7 MIL/uL (ref 4.22–5.81)
RDW: 13.2 % (ref 11.5–15.5)
WBC: 4.9 K/uL (ref 4.0–10.5)
nRBC: 0 % (ref 0.0–0.2)

## 2023-11-26 LAB — TROPONIN T, HIGH SENSITIVITY: Troponin T High Sensitivity: <15 ng/L (ref <19)

## 2023-11-26 MED ORDER — POTASSIUM CHLORIDE CRYS ER 20 MEQ PO TBCR
20.0000 meq | EXTENDED_RELEASE_TABLET | Freq: Once | ORAL | Status: AC
  Administered 2023-11-26: 20 meq via ORAL
  Filled 2023-11-26: qty 1

## 2023-11-26 NOTE — ED Notes (Signed)
 AVS provided by edp was reviewed with pt. Pt verbalized understanding with no additional questions at this time.

## 2023-11-26 NOTE — ED Triage Notes (Signed)
 Pt c/o anxiety, palpitations since starting fluoxetine appx 10 days ago. Reports BLLE pain x1 week, denies swelling.   Also reports feeling like palpitations are causing a cough, feeling weak.

## 2023-11-26 NOTE — Discharge Instructions (Signed)
 You were seen in the emergency department for palpitations and cough.  Your EKG showed frequent PVCs.  You had lab work including heart testing along with a chest x-ray that did not show any significant abnormalities.  These PVCs may be related to your new medications so I would recommend stopping that for now.  Keep well-hydrated.  Avoid stimulants such as caffeine.  Follow-up with your regular doctor.  Return if any worsening or concerning symptoms

## 2023-11-26 NOTE — ED Provider Notes (Signed)
 [facility] Provider Note   CSN: [id number] Arrival date & time: 11/26/23  1754     Patient presents with: Palpitations   Daniel Novak is a 63 y.o. male.  He is here with a complaint of palpitations skipped beat leading to a cough and dizziness that has been going on for over a week.  Symptoms have been intermittent.  He feels that they have started since he is was put on fluoxetine and a statin about 10 days ago.  He used to get a lot of PVCs when he used caffeine but since stopping caffeine he has not been troubled with him.  Feels them mostly at night.  Tried to reach his PCP today to discuss this but they had closed so he came here for further evaluation.  No history of any cardiac disease.  {Add pertinent medical, surgical, social history, OB history to YEP:67052} The history is provided by the patient.  Palpitations Palpitations quality:  Irregular Onset quality:  Gradual Duration:  1 week Timing:  Intermittent Progression:  Unchanged Chronicity:  New Relieved by:  Nothing Ineffective treatments:  None tried Associated symptoms: cough and dizziness   Associated symptoms: no chest pain, no chest pressure, no lower extremity edema, no nausea, no shortness of breath and no vomiting        Prior to Admission medications   Medication Sig Start Date End Date Taking? Authorizing Provider  ALPRAZolam (XANAX) 0.5 MG tablet Take 0.5 mg by mouth 2 (two) times daily as needed for anxiety.    [provider]  APPLE CIDER VINEGAR PO Take 1 capsule by mouth daily.    [provider]  bacitracin -polymyxin b  (POLYSPORIN ) ophthalmic ointment Place into the right eye 2 (two) times daily. Place a 1/2 inch ribbon of ointment into the lower eyelid. 12/13/22   Valdemar Rogue, MD  citalopram (CELEXA) 40 MG tablet Take 40 mg by mouth daily. 12/16/18   [provider]  cycloSPORINE (RESTASIS) 0.05 % ophthalmic emulsion Apply 1 drop  to eye 2 (two) times daily.    [provider]  famotidine (PEPCID) 40 MG tablet Take 40 mg by mouth daily.    [provider]  magnesium oxide (MAG-OX) 400 (240 Mg) MG tablet Take 400 mg by mouth daily.    [provider]  Misc Natural Products (BLOOD SUGAR BALANCE PO) Take 2 tablets by mouth daily. Glucose tablets    [provider]  Multiple Vitamin (MULTIVITAMIN) tablet Take 1 tablet by mouth daily.    [provider]  OVER THE COUNTER MEDICATION Take 2 capsules by mouth daily. Super Fruits and Microbiologist, Historical, MD  pravastatin (PRAVACHOL) 10 MG tablet Take 10 mg by mouth at bedtime. 11/09/22   [provider]  prednisoLONE  acetate (PRED FORTE ) 1 % ophthalmic suspension Place 1 drop into the right eye 3 (three) times daily. 12/13/22   Valdemar Rogue, MD  PRESCRIPTION MEDICATION Pt has CPAP machine    [provider]  sildenafil (REVATIO) 20 MG tablet TAKE 2 TO 5 TABLETS BY MOUTH 30 MINUTES PRIOR TO SEXUAL ACTIVITY 12/17/18   [provider]  Testosterone  (ANDROGEL ) 20.25 MG/1.25GM (1.62%) GEL Place 1 Application onto the skin daily.    [provider]    Allergies: Erythromycin and Penicillins    Review of Systems  Constitutional:  Negative for fever.  HENT:  Negative for sore throat.   Respiratory:  Positive for cough. Negative  for shortness of breath.   Cardiovascular:  Positive for palpitations. Negative for chest pain.  Gastrointestinal:  Negative for abdominal pain, nausea and vomiting.  Genitourinary:  Negative for dysuria.  Skin:  Negative for rash.  Neurological:  Positive for dizziness.    Updated Vital Signs BP (!) 149/88 (BP Location: Right Arm)   Pulse 88   Temp 98.9 F (37.2 C)   Resp 16   Ht 5' 7 (1.702 m)   Wt 70.3 kg   SpO2 97%   BMI 24.28 kg/m   Physical Exam Vitals and nursing note reviewed.  Constitutional:      General: He is not in acute distress.     Appearance: He is well-developed.  HENT:     Head: Normocephalic and atraumatic.  Eyes:     Conjunctiva/sclera: Conjunctivae normal.  Cardiovascular:     Rate and Rhythm: Normal rate and regular rhythm.     Pulses: Normal pulses.     Heart sounds: No murmur heard. Pulmonary:     Effort: Pulmonary effort is normal. No respiratory distress.     Breath sounds: Normal breath sounds.  Abdominal:     Palpations: Abdomen is soft.     Tenderness: There is no abdominal tenderness.  Musculoskeletal:        General: No deformity.     Cervical back: Neck supple.  Skin:    General: Skin is warm and dry.     Capillary Refill: Capillary refill takes less than 2 seconds.  Neurological:     General: No focal deficit present.     Mental Status: He is alert.  Psychiatric:        Mood and Affect: Mood normal.     (all labs ordered are listed, but only abnormal results are displayed) Labs Reviewed  BASIC METABOLIC PANEL WITH GFR  CBC  MAGNESIUM  TSH  TROPONIN T, HIGH SENSITIVITY    EKG: EKG Interpretation Date/Time:  Tuesday November 26 2023 18:02:05 EDT Ventricular Rate:  102 PR Interval:  144 QRS Duration:  89 QT Interval:  359 QTC Calculation: 399 R Axis:   -14  Text Interpretation: Sinus tachycardia Ventricular bigeminy Baseline wander in lead(s) V5 increased ectopy from prior 8/08 Confirmed by Towana Sharper [id number] on 11/26/2023 6:06:59 PM  Radiology: No results found.  {Document cardiac monitor, telemetry assessment procedure when appropriate:32947} Procedures   Medications Ordered in the ED - No data to display    {Click here for ABCD2, HEART and other calculators REFRESH Note before signing:1}                              Medical Decision Making Amount and/or Complexity of Data Reviewed Labs: ordered. Radiology: ordered.   This patient complains of ***; this involves an extensive number of treatment Options and is a complaint that carries with it a high risk of  complications and morbidity. The differential includes ***  I ordered, reviewed and interpreted labs, which included *** I ordered medication *** and reviewed PMP when indicated. I ordered imaging studies which included *** and I independently    visualized and interpreted imaging which showed *** Additional history obtained from *** Previous records obtained and reviewed *** I consulted *** and discussed lab and imaging findings and discussed disposition.  Cardiac monitoring reviewed, *** Social determinants considered, *** Critical Interventions: ***  After the interventions stated above, I reevaluated the patient and found *** Admission and further testing  considered, ***   {Document critical care time when appropriate  Document review of labs and clinical decision tools ie CHADS2VASC2, etc  Document your independent review of radiology images and any outside records  Document your discussion with family members, caretakers and with consultants  Document social determinants of health affecting pt's care  Document your decision making why or why not admission, treatments were needed:32947:::1}   Final diagnoses:  None    ED Discharge Orders     None

## 2024-02-04 NOTE — Progress Notes (Signed)
 [facility] - Clinic Note  02/10/2024   CHIEF COMPLAINT Patient presents for Retina Follow Up  HISTORY OF PRESENT ILLNESS: Daniel Novak is a 63 y.o. male who presents to the clinic today for:  HPI     Retina Follow Up   Patient presents with  PVD.  In both eyes.  This started 3 months ago.  I, the attending physician,  performed the HPI with the patient and updated documentation appropriately.        Comments   Pt states he continues to have a FOL in the peripheral of the right eye that he sees 3-4 times per day. Pt states he is having lots of floaters in the left eye, he has a hard time seeing to read and they are annoying. Pt also sees black dots in the right eye. Pt denies changes in vision. Pt has a little stabbing pain in the OD occasionally but nothing major. Pt is using Systane TID OU.      Last edited by Valdemar Rogue, MD on 02/10/2024  9:26 PM.     Patient wants to have surgery for the floaters in the left eye.   Referring physician: Alben Therisa MATSU, [place] [address] [place],  [place] [address]  HISTORICAL INFORMATION:  Selected notes from the MEDICAL RECORD NUMBER Referred by Dr. Jarold for visually significant vit opacities and PCO OU LEE:  Ocular Hx- PMH-   CURRENT MEDICATIONS: Current Outpatient Medications (Ophthalmic Drugs)  Medication Sig   bacitracin -polymyxin b  (POLYSPORIN ) ophthalmic ointment Place into the right eye 2 (two) times daily. Place a 1/2 inch ribbon of ointment into the lower eyelid.   cycloSPORINE (RESTASIS) 0.05 % ophthalmic emulsion Apply 1 drop to eye 2 (two) times daily.   prednisoLONE  acetate (PRED FORTE ) 1 % ophthalmic suspension Place 1 drop into the right eye 3 (three) times daily.   No current facility-administered medications for this visit. (Ophthalmic Drugs)   Current Outpatient Medications (Other)  Medication Sig   gabapentin (NEURONTIN) 300 MG capsule Take by mouth.   rosuvastatin (CRESTOR)  5 MG tablet Take 5 mg by mouth daily.   ALPRAZolam (XANAX) 0.5 MG tablet Take 0.5 mg by mouth 2 (two) times daily as needed for anxiety.   APPLE CIDER VINEGAR PO Take 1 capsule by mouth daily.   citalopram (CELEXA) 40 MG tablet Take 40 mg by mouth daily.   famotidine (PEPCID) 40 MG tablet Take 40 mg by mouth daily.   magnesium oxide (MAG-OX) 400 (240 Mg) MG tablet Take 400 mg by mouth daily.   Misc Natural Products (BLOOD SUGAR BALANCE PO) Take 2 tablets by mouth daily. Glucose tablets   Multiple Vitamin (MULTIVITAMIN) tablet Take 1 tablet by mouth daily.   OVER THE COUNTER MEDICATION Take 2 capsules by mouth daily. Super Fruits and Veggies   pravastatin (PRAVACHOL) 10 MG tablet Take 10 mg by mouth at bedtime. (Patient not taking: Reported on 02/10/2024)   PRESCRIPTION MEDICATION Pt has CPAP machine   sildenafil (REVATIO) 20 MG tablet TAKE 2 TO 5 TABLETS BY MOUTH 30 MINUTES PRIOR TO SEXUAL ACTIVITY   Testosterone  (ANDROGEL ) 20.25 MG/1.25GM (1.62%) GEL Place 1 Application onto the skin daily.   No current facility-administered medications for this visit. (Other)   REVIEW OF SYSTEMS: ROS   Positive for: Eyes Negative for: Constitutional, Gastrointestinal, Neurological, Skin, Genitourinary, Musculoskeletal, HENT, Endocrine, Cardiovascular, Respiratory, Psychiatric, Allergic/Imm, Heme/Lymph Last edited by Elnor Avelina RAMAN, COT on 02/10/2024  2:48  PM.       ALLERGIES Allergies  Allergen Reactions   Erythromycin Other (See Comments)    Stomach pain    Penicillins Hives   PAST MEDICAL HISTORY Past Medical History:  Diagnosis Date   Anxiety    Arthritis    Dry eye syndrome    post cataract surgery   Fibromyalgia    GERD (gastroesophageal reflux disease)    Headache    History of kidney stones    Pre-diabetes    Sleep apnea    Past Surgical History:  Procedure Laterality Date   CATARACT EXTRACTION Bilateral    CHOLECYSTECTOMY     COLONOSCOPY     HAMMER TOE SURGERY Right     INGUINAL HERNIA REPAIR     unsure which side   KNEE ARTHROSCOPY Right    PARS PLANA VITRECTOMY Right 12/06/2022   Procedure: PARS PLANA VITRECTOMY WITH 25 GAUGE;  Surgeon: Valdemar Rogue, MD;  Location: [facility] OR;  Service: Ophthalmology;  Laterality: Right;   PHOTOCOAGULATION WITH LASER Right 12/06/2022   Procedure: PHOTOCOAGULATION WITH LASER;  Surgeon: Valdemar Rogue, MD;  Location: [facility] OR;  Service: Ophthalmology;  Laterality: Right;   TONSILLECTOMY     FAMILY HISTORY History reviewed. No pertinent family history. SOCIAL HISTORY Social History   Tobacco Use   Smoking status: Never   Smokeless tobacco: Never  Vaping Use   Vaping status: Never Used  Substance Use Topics   Alcohol use: Not Currently   Drug use: Never      OPHTHALMIC EXAM:  Base Eye Exam     Visual Acuity (Snellen - Linear)       Right Left   Dist cc 20/15 -1 20/20         Tonometry (Tonopen, 2:51 PM)       Right Left   Pressure 18 14         Pupils       Pupils Dark Light Shape React APD   Right PERRL 3 2 Round Minimal None   Left PERRL 3 2 Round Minimal None         Visual Fields       Left Right    Full Full         Extraocular Movement       Right Left    Full, Ortho Full, Ortho         Neuro/Psych     Oriented x3: Yes   Mood/Affect: Normal         Dilation     Both eyes: 1.0% Mydriacyl , 2.5% Phenylephrine  @ 2:51 PM           Slit Lamp and Fundus Exam     Slit Lamp Exam       Right Left   Lids/Lashes Dermatochalasis - upper lid Dermatochalasis - upper lid   Conjunctiva/Sclera white and quiet White and quiet   Cornea well healed cataract wound, mild arcus, trace tear film debris well healed cataract wound, trace Punctate epithelial erosions, trace tear film debris   Anterior Chamber deep and clear deep and clear   Iris Round and dilated Round and dilated   Lens PC IOL in good position, 1+ Posterior capsular opacification approaching center PC IOL in good  position, trace Posterior capsular opacification   Anterior Vitreous post vitrectomy, residual pigment in anterior vit mild syneresis, Posterior vitreous detachment, + vit opacities, Vitreous condensations         Fundus Exam  Right Left   Disc Pink and Sharp, Compact Pallor, Sharp rim   C/D Ratio 0.1 0.2   Macula Flat, good foveal reflex, RPE mottling, No heme or edema Flat, Good foveal reflex, No heme or edema   Vessels attenuated, mild tortuosity, mild copper wiring mild attenuation, mild tortuosity, AV crossing changes, mild copper wiring   Periphery Attached, no edema, good 360 peripheral laser, peripheral schisis cavity 0300 with good laser surrounding Attached, No heme           IMAGING AND PROCEDURES  Imaging and Procedures for 02/10/2024  OCT, Retina - OU - Both Eyes       Right Eye Quality was good. Central Foveal Thickness: 283. Progression has been stable. Findings include normal foveal contour, no IRF, no SRF (Trace vitreous opacities -- stably improved).   Left Eye Quality was good. Central Foveal Thickness: 262. Progression has been stable. Findings include normal foveal contour, no IRF, no SRF (Trace vit opacities).   Notes *Images captured and stored on drive  Diagnosis / Impression:  NFP, no IRF/SRF OU OD: Trace vitreous opacities   Clinical management:  See below  Abbreviations: NFP - Normal foveal profile. CME - cystoid macular edema. PED - pigment epithelial detachment. IRF - intraretinal fluid. SRF - subretinal fluid. EZ - ellipsoid zone. ERM - epiretinal membrane. ORA - outer retinal atrophy. ORT - outer retinal tubulation. SRHM - subretinal hyper-reflective material. IRHM - intraretinal hyper-reflective material            ASSESSMENT/PLAN:   ICD-10-CM   1. Posterior vitreous detachment of both eyes  H43.813 OCT, Retina - OU - Both Eyes    2. Vitreous opacities of both eyes  H43.393     3. Pseudophakia, both eyes  Z96.1       **lost to retina f/u from 01.13.25 to 10.20.25**  1,2. PVD w/ visually significant vitreous opacities OU  - pt reports debilitating vitreous opacities x 5 years OU - pt reports intermittent periods with inability to read due to vitreous opacities / floaters OU - was going to schedule PPV w/ Dr. Jarold, but ran into insurance issues with the surgery center  - s/p PPV/EL OD, 08.15.2024 - doing well -- reports interval improvement in floaters OD, but floaters OS remain bothersome             - IOP okay at 18, 14  - OCT shows trace vitreous opacities -- stably improved OD  - off all drops  - BCVA OD 20/15 from 20/20; OS 20/20  - pt wishes to proceed with PPV/EL OS around 12.04.25  - f/u around 11.17.25, DFE, OCT, surgical planning  3. Pseudophakia OU  - s/p CE/IOL OU (Dr. Meridee)  - IOLs in good position, doing well  - mild PCO OU  - monitor  Ophthalmic Meds Ordered this visit:  No orders of the defined types were placed in this encounter.    Return in about 4 weeks (around 03/09/2024) for f/u PVD, DFE, OCT, surgical preop.  There are no Patient Instructions on file for this visit.  Explained the diagnoses, plan, and follow up with the patient and they expressed understanding.  Patient expressed understanding of the importance of proper follow up care.   This document serves as a record of services personally performed by Redell JUDITHANN Hans, MD, PhD. It was created on their behalf by Avelina Pereyra, COA an ophthalmic technician. The creation of this record is the provider's dictation and/or activities during the visit.  Electronically signed by: Avelina GORMAN Pereyra, COT  02/10/24  9:26 PM   This document serves as a record of services personally performed by Redell JUDITHANN Hans, MD, PhD. It was created on their behalf by Wanda GEANNIE Keens, COT an ophthalmic technician. The creation of this record is the provider's dictation and/or activities during the visit.    Electronically  signed by:  Wanda GEANNIE Keens, COT  02/10/24 9:26 PM  Redell JUDITHANN Hans, M.D., Ph.D. Diseases & Surgery of the Retina and Vitreous [facility]  I have reviewed the above documentation for accuracy and completeness, and I agree with the above. Redell JUDITHANN Hans, M.D., Ph.D. 02/10/24 9:58 PM    Abbreviations: M myopia (nearsighted); A astigmatism; H hyperopia (farsighted); P presbyopia; Mrx spectacle prescription;  CTL contact lenses; OD right eye; OS left eye; OU both eyes  XT exotropia; ET esotropia; PEK punctate epithelial keratitis; PEE punctate epithelial erosions; DES dry eye syndrome; MGD meibomian gland dysfunction; ATs artificial tears; PFAT's preservative free artificial tears; NSC nuclear sclerotic cataract; PSC posterior subcapsular cataract; ERM epi-retinal membrane; PVD posterior vitreous detachment; RD retinal detachment; DM diabetes mellitus; DR diabetic retinopathy; NPDR non-proliferative diabetic retinopathy; PDR proliferative diabetic retinopathy; CSME clinically significant macular edema; DME diabetic macular edema; dbh dot blot hemorrhages; CWS cotton wool spot; POAG primary open angle glaucoma; C/D cup-to-disc ratio; HVF humphrey visual field; GVF goldmann visual field; OCT optical coherence tomography; IOP intraocular pressure; BRVO Branch retinal vein occlusion; CRVO central retinal vein occlusion; CRAO central retinal artery occlusion; BRAO branch retinal artery occlusion; RT retinal tear; SB scleral buckle; PPV pars plana vitrectomy; VH Vitreous hemorrhage; PRP panretinal laser photocoagulation; IVK intravitreal kenalog ; VMT vitreomacular traction; MH Macular hole;  NVD neovascularization of the disc; NVE neovascularization elsewhere; AREDS age related eye disease study; ARMD age related macular degeneration; POAG primary open angle glaucoma; EBMD epithelial/anterior basement membrane dystrophy; ACIOL anterior chamber intraocular lens; IOL intraocular lens;  PCIOL posterior chamber intraocular lens; Phaco/IOL phacoemulsification with intraocular lens placement; PRK photorefractive keratectomy; LASIK laser assisted in situ keratomileusis; HTN hypertension; DM diabetes mellitus; COPD chronic obstructive pulmonary disease

## 2024-02-10 ENCOUNTER — Encounter (INDEPENDENT_AMBULATORY_CARE_PROVIDER_SITE_OTHER): Payer: Self-pay | Admitting: Ophthalmology

## 2024-02-10 ENCOUNTER — Ambulatory Visit (INDEPENDENT_AMBULATORY_CARE_PROVIDER_SITE_OTHER): Admitting: Ophthalmology

## 2024-02-10 DIAGNOSIS — H43393 Other vitreous opacities, bilateral: Secondary | ICD-10-CM | POA: Diagnosis not present

## 2024-02-10 DIAGNOSIS — Z961 Presence of intraocular lens: Secondary | ICD-10-CM

## 2024-02-10 DIAGNOSIS — H43813 Vitreous degeneration, bilateral: Secondary | ICD-10-CM | POA: Diagnosis not present

## 2024-02-25 NOTE — Progress Notes (Signed)
 [facility] - Clinic Note  03/09/2024   CHIEF COMPLAINT Patient presents for Retina Follow Up  HISTORY OF PRESENT ILLNESS: Daniel Novak is a 63 y.o. male who presents to the clinic today for:  HPI     Retina Follow Up   Patient presents with  PVD.  In both eyes.  This started 1.5 years ago.  Duration of 4 weeks.  I, the attending physician,  performed the HPI with the patient and updated documentation appropriately.        Comments   Pt states he continues to have a FOL in the peripheral of the right eye that he sees 3-4 times per day. Pt states he is having lots of floaters caked up in the left eye, that cause issues with both NVA and DVA. Pt sees black dots in the right eye, but they are small and not seen often. Pt is using Systane TID OU. Pt denies pain.      Last edited by Valdemar Rogue, MD on 03/10/2024 12:25 PM.     Referring physician: Teresa Thersia Jansky, MD [address] [place],  [place] [address]  HISTORICAL INFORMATION:  Selected notes from the MEDICAL RECORD NUMBER Referred by Dr. Jarold for visually significant vit opacities and PCO OU LEE:  Ocular Hx- PMH-   CURRENT MEDICATIONS: Current Outpatient Medications (Ophthalmic Drugs)  Medication Sig   bacitracin -polymyxin b  (POLYSPORIN ) ophthalmic ointment Place into the right eye 2 (two) times daily. Place a 1/2 inch ribbon of ointment into the lower eyelid.   cycloSPORINE (RESTASIS) 0.05 % ophthalmic emulsion Apply 1 drop to eye 2 (two) times daily.   prednisoLONE  acetate (PRED FORTE ) 1 % ophthalmic suspension Place 1 drop into the right eye 3 (three) times daily.   No current facility-administered medications for this visit. (Ophthalmic Drugs)   Current Outpatient Medications (Other)  Medication Sig   ALPRAZolam (XANAX) 0.5 MG tablet Take 0.5 mg by mouth 2 (two) times daily as needed for anxiety.   APPLE CIDER VINEGAR PO Take 1 capsule by mouth daily.   citalopram (CELEXA) 40  MG tablet Take 40 mg by mouth daily.   famotidine (PEPCID) 40 MG tablet Take 40 mg by mouth daily.   gabapentin (NEURONTIN) 300 MG capsule Take by mouth.   magnesium oxide (MAG-OX) 400 (240 Mg) MG tablet Take 400 mg by mouth daily.   Misc Natural Products (BLOOD SUGAR BALANCE PO) Take 2 tablets by mouth daily. Glucose tablets   Multiple Vitamin (MULTIVITAMIN) tablet Take 1 tablet by mouth daily.   OVER THE COUNTER MEDICATION Take 2 capsules by mouth daily. Super Fruits and Veggies   pravastatin (PRAVACHOL) 10 MG tablet Take 10 mg by mouth at bedtime. (Patient not taking: Reported on 02/10/2024)   PRESCRIPTION MEDICATION Pt has CPAP machine   rosuvastatin (CRESTOR) 5 MG tablet Take 5 mg by mouth daily.   sildenafil (REVATIO) 20 MG tablet TAKE 2 TO 5 TABLETS BY MOUTH 30 MINUTES PRIOR TO SEXUAL ACTIVITY   Testosterone  (ANDROGEL ) 20.25 MG/1.25GM (1.62%) GEL Place 1 Application onto the skin daily.   No current facility-administered medications for this visit. (Other)   REVIEW OF SYSTEMS: ROS   Positive for: Eyes Negative for: Constitutional, Gastrointestinal, Neurological, Skin, Genitourinary, Musculoskeletal, HENT, Endocrine, Cardiovascular, Respiratory, Psychiatric, Allergic/Imm, Heme/Lymph Last edited by Elnor Avelina RAMAN, COT on 03/09/2024  2:19 PM.        ALLERGIES Allergies  Allergen Reactions   Erythromycin Other (See Comments)  Stomach pain    Penicillins Hives   PAST MEDICAL HISTORY Past Medical History:  Diagnosis Date   Anxiety    Arthritis    Dry eye syndrome    post cataract surgery   Fibromyalgia    GERD (gastroesophageal reflux disease)    Headache    History of kidney stones    Pre-diabetes    Sleep apnea    Past Surgical History:  Procedure Laterality Date   CATARACT EXTRACTION Bilateral    CHOLECYSTECTOMY     COLONOSCOPY     HAMMER TOE SURGERY Right    INGUINAL HERNIA REPAIR     unsure which side   KNEE ARTHROSCOPY Right    PARS PLANA VITRECTOMY  Right 12/06/2022   Procedure: PARS PLANA VITRECTOMY WITH 25 GAUGE;  Surgeon: Valdemar Rogue, MD;  Location: [facility] OR;  Service: Ophthalmology;  Laterality: Right;   PHOTOCOAGULATION WITH LASER Right 12/06/2022   Procedure: PHOTOCOAGULATION WITH LASER;  Surgeon: Valdemar Rogue, MD;  Location: [facility] OR;  Service: Ophthalmology;  Laterality: Right;   TONSILLECTOMY     FAMILY HISTORY History reviewed. No pertinent family history. SOCIAL HISTORY Social History   Tobacco Use   Smoking status: Never   Smokeless tobacco: Never  Vaping Use   Vaping status: Never Used  Substance Use Topics   Alcohol use: Not Currently   Drug use: Never      OPHTHALMIC EXAM:  Base Eye Exam     Visual Acuity (Snellen - Linear)       Right Left   Dist Gage 20/20 +1 20/20 -1         Tonometry (Tonopen, 2:24 PM)       Right Left   Pressure 19 12         Pupils       Pupils Dark Light Shape React APD   Right PERRL 3 2 Round Brisk None   Left PERRL 3 2 Round Brisk None         Visual Fields       Left Right    Full Full         Extraocular Movement       Right Left    Full, Ortho Full, Ortho         Neuro/Psych     Oriented x3: Yes   Mood/Affect: Normal         Dilation     Both eyes: 1.0% Mydriacyl , 2.5% Phenylephrine  @ 2:24 PM           Slit Lamp and Fundus Exam     Slit Lamp Exam       Right Left   Lids/Lashes Dermatochalasis - upper lid Dermatochalasis - upper lid   Conjunctiva/Sclera white and quiet White and quiet   Cornea well healed cataract wound, mild arcus, trace tear film debris well healed cataract wound, trace Punctate epithelial erosions, trace tear film debris   Anterior Chamber deep and clear deep and clear   Iris Round and dilated Round and dilated   Lens PC IOL in good position, 1+ Posterior capsular opacification approaching center PC IOL in good position, trace Posterior capsular opacification   Anterior Vitreous post vitrectomy, residual pigment  in anterior vit mild syneresis, Posterior vitreous detachment, + vit opacities, Vitreous condensations         Fundus Exam       Right Left   Disc Pink and Sharp, Compact Pallor, Sharp rim   C/D Ratio 0.1 0.2  Macula Flat, good foveal reflex, RPE mottling, No heme or edema Flat, Good foveal reflex, No heme or edema   Vessels attenuated, mild tortuosity, mild copper wiring mild attenuation, mild tortuosity, AV crossing changes, mild copper wiring   Periphery Attached, no edema, good 360 peripheral laser, peripheral schisis cavity 0300 with good laser surrounding Attached, No heme           IMAGING AND PROCEDURES  Imaging and Procedures for 03/09/2024  OCT, Retina - OU - Both Eyes       Right Eye Quality was good. Central Foveal Thickness: 286. Progression has been stable. Findings include normal foveal contour, no IRF, no SRF (Stable improvement in vitreous opacities (post PPV)).   Left Eye Quality was good. Central Foveal Thickness: 260. Progression has been stable. Findings include normal foveal contour, no IRF, no SRF (Trace vit opacities).   Notes *Images captured and stored on drive  Diagnosis / Impression:  NFP, no IRF/SRF OU OD: Stable improvement in vitreous opacities (post PPV) OS: +vit opacities  Clinical management:  See below  Abbreviations: NFP - Normal foveal profile. CME - cystoid macular edema. PED - pigment epithelial detachment. IRF - intraretinal fluid. SRF - subretinal fluid. EZ - ellipsoid zone. ERM - epiretinal membrane. ORA - outer retinal atrophy. ORT - outer retinal tubulation. SRHM - subretinal hyper-reflective material. IRHM - intraretinal hyper-reflective material           ASSESSMENT/PLAN:   ICD-10-CM   1. Posterior vitreous detachment of both eyes  H43.813 OCT, Retina - OU - Both Eyes    2. Vitreous opacities of both eyes  H43.393     3. Pseudophakia, both eyes  Z96.1      **lost to retina f/u from 01.13.25 to 10.20.25**  1,2.  PVD w/ visually significant vitreous opacities OU  - pt reports debilitating vitreous opacities x 5 years OU - pt reports intermittent periods with inability to read due to vitreous opacities / floaters OU - was going to schedule PPV w/ Dr. Jarold, but ran into insurance issues with the surgery center  - s/p PPV/EL OD, 08.15.2024 - doing well -- reports interval improvement in floaters OD, but floaters OS remain bothersome             - IOP okay at 19, 12 - OCT shows: OD Stable improvement in vitreous opacities (post PPV); OS: +vit opacities  - off all drops  - BCVA OD 20/20 from 20/15; OS 20/20  - pt wishes to proceed with PPV/EL OS on 12.04.25 - RBA of procedure discussed, questions answered - informed consent obtained and signed - case scheduled for Thursday, 12.04.25  - f/u 12.5.25 for POV1, DFE, OCT  3. Pseudophakia OU  - s/p CE/IOL OU (Dr. Meridee)  - IOLs in good position, doing well  - mild PCO OU  - monitor  Ophthalmic Meds Ordered this visit:  No orders of the defined types were placed in this encounter.    Return in about 18 days (around 03/27/2024) for POV1 s/p PPV OS, DFE, OCT OU.  There are no Patient Instructions on file for this visit.  Explained the diagnoses, plan, and follow up with the patient and they expressed understanding.  Patient expressed understanding of the importance of proper follow up care.   This document serves as a record of services personally performed by Redell JUDITHANN Hans, MD, PhD. It was created on their behalf by Avelina Pereyra, COA an ophthalmic technician. The creation of this record is  the provider's dictation and/or activities during the visit.   Electronically signed by: Avelina GORMAN Pereyra, COT  03/10/24  12:28 PM   This document serves as a record of services personally performed by Redell JUDITHANN Hans, MD, PhD. It was created on their behalf by Wanda GEANNIE Keens, COT an ophthalmic technician. The creation of this record is the provider's  dictation and/or activities during the visit.    Electronically signed by:  Wanda GEANNIE Keens, COT  03/10/24 12:28 PM  Redell JUDITHANN Hans, M.D., Ph.D. Diseases & Surgery of the Retina and Vitreous [facility]  I have reviewed the above documentation for accuracy and completeness, and I agree with the above. Redell JUDITHANN Hans, M.D., Ph.D. 03/10/24 12:29 PM    Abbreviations: M myopia (nearsighted); A astigmatism; H hyperopia (farsighted); P presbyopia; Mrx spectacle prescription;  CTL contact lenses; OD right eye; OS left eye; OU both eyes  XT exotropia; ET esotropia; PEK punctate epithelial keratitis; PEE punctate epithelial erosions; DES dry eye syndrome; MGD meibomian gland dysfunction; ATs artificial tears; PFAT's preservative free artificial tears; NSC nuclear sclerotic cataract; PSC posterior subcapsular cataract; ERM epi-retinal membrane; PVD posterior vitreous detachment; RD retinal detachment; DM diabetes mellitus; DR diabetic retinopathy; NPDR non-proliferative diabetic retinopathy; PDR proliferative diabetic retinopathy; CSME clinically significant macular edema; DME diabetic macular edema; dbh dot blot hemorrhages; CWS cotton wool spot; POAG primary open angle glaucoma; C/D cup-to-disc ratio; HVF humphrey visual field; GVF goldmann visual field; OCT optical coherence tomography; IOP intraocular pressure; BRVO Branch retinal vein occlusion; CRVO central retinal vein occlusion; CRAO central retinal artery occlusion; BRAO branch retinal artery occlusion; RT retinal tear; SB scleral buckle; PPV pars plana vitrectomy; VH Vitreous hemorrhage; PRP panretinal laser photocoagulation; IVK intravitreal kenalog ; VMT vitreomacular traction; MH Macular hole;  NVD neovascularization of the disc; NVE neovascularization elsewhere; AREDS age related eye disease study; ARMD age related macular degeneration; POAG primary open angle glaucoma; EBMD epithelial/anterior basement membrane dystrophy;  ACIOL anterior chamber intraocular lens; IOL intraocular lens; PCIOL posterior chamber intraocular lens; Phaco/IOL phacoemulsification with intraocular lens placement; PRK photorefractive keratectomy; LASIK laser assisted in situ keratomileusis; HTN hypertension; DM diabetes mellitus; COPD chronic obstructive pulmonary disease

## 2024-03-09 ENCOUNTER — Ambulatory Visit (INDEPENDENT_AMBULATORY_CARE_PROVIDER_SITE_OTHER): Admitting: Ophthalmology

## 2024-03-09 ENCOUNTER — Encounter (INDEPENDENT_AMBULATORY_CARE_PROVIDER_SITE_OTHER): Payer: Self-pay | Admitting: Ophthalmology

## 2024-03-09 DIAGNOSIS — H43813 Vitreous degeneration, bilateral: Secondary | ICD-10-CM | POA: Diagnosis not present

## 2024-03-09 DIAGNOSIS — Z961 Presence of intraocular lens: Secondary | ICD-10-CM | POA: Diagnosis not present

## 2024-03-09 DIAGNOSIS — H43393 Other vitreous opacities, bilateral: Secondary | ICD-10-CM | POA: Diagnosis not present

## 2024-03-10 ENCOUNTER — Encounter (INDEPENDENT_AMBULATORY_CARE_PROVIDER_SITE_OTHER): Payer: Self-pay | Admitting: Ophthalmology

## 2024-03-23 ENCOUNTER — Encounter (HOSPITAL_COMMUNITY): Payer: Self-pay | Admitting: Ophthalmology

## 2024-03-23 ENCOUNTER — Other Ambulatory Visit: Payer: Self-pay

## 2024-03-23 NOTE — Progress Notes (Signed)
 SDW CALL  Patient was given pre-op instructions over the phone. The opportunity was given for the patient to ask questions. No further questions asked. Patient verbalized understanding of instructions given.   PCP - Thersia Pizza Cardiologist - patient saw Reyes Cowden in May 2025 for palpitations - patient had echo in May and stated he was told he did not need to follow back up  PPM/ICD - denies Device Orders - n/a Rep Notified -  n/a  Chest x-ray - 11/26/23 EKG - 11/26/23 Stress Test - denies ECHO - 09/05/23 Cardiac Cath - denies  Sleep Study - OSA+ and wears CPAP   Pre-diabetes and does not check blood sugar at home  Last dose of GLP1 agonist-  n/a GLP1 instructions:  n/a  Blood Thinner Instructions: n/a Aspirin Instructions: n/a  ERAS Protcol - NPO PRE-SURGERY Ensure or G2- n/a  COVID TEST- n/a   Anesthesia review: yes - difficult intubation, EKG, recent URI  Patient states that he has recently had a cold that started on Wednesday (11/26), sore throat, congestion and productive cough.  Patient states that it is starting to get better but cough is still present.  Notified anesthesia and will notify office.  Notified Angel at Dr. Agnes office on 12/2.    All instructions explained to the patient, with a verbal understanding of the material. Patient agrees to go over the instructions while at home for a better understanding.

## 2024-03-26 ENCOUNTER — Ambulatory Visit (HOSPITAL_COMMUNITY): Admission: RE | Admit: 2024-03-26 | Source: Home / Self Care | Admitting: Ophthalmology

## 2024-03-26 ENCOUNTER — Encounter (HOSPITAL_COMMUNITY): Admission: RE | Payer: Self-pay | Source: Home / Self Care

## 2024-03-26 HISTORY — DX: Depression, unspecified: F32.A

## 2024-03-26 SURGERY — PARS PLANA VITRECTOMY WITH 25 GAUGE WITH ENDOLASER PANRETINAL PHOTOCOAGULATION
Anesthesia: General | Laterality: Left

## 2024-03-27 ENCOUNTER — Encounter (INDEPENDENT_AMBULATORY_CARE_PROVIDER_SITE_OTHER): Admitting: Ophthalmology

## 2024-05-28 NOTE — Progress Notes (Shared)
 " [facility] - Clinic Note  06/02/2024   CHIEF COMPLAINT Patient presents for No chief complaint on file.  HISTORY OF PRESENT ILLNESS: Daniel Novak is a 64 y.o. male who presents to the clinic today for:    Referring physician: Teresa Thersia Jansky, MD [id number] [place],  [place] [address]  HISTORICAL INFORMATION:  Selected notes from the MEDICAL RECORD NUMBER Referred by Dr. Jarold for visually significant vit opacities and PCO OU LEE:  Ocular Hx- PMH-   CURRENT MEDICATIONS: Current Outpatient Medications (Ophthalmic Drugs)  Medication Sig   Polyethyl Glycol-Propyl Glycol (SYSTANE OP) Place 1 drop into both eyes daily at 12 noon.   No current facility-administered medications for this visit. (Ophthalmic Drugs)   Current Outpatient Medications (Other)  Medication Sig   acetaminophen  (TYLENOL ) 500 MG tablet Take 500 mg by mouth every 6 (six) hours as needed for moderate pain (pain score 4-6) or mild pain (pain score 1-3).   ALPRAZolam (XANAX) 0.5 MG tablet Take 0.5 mg by mouth 2 (two) times daily as needed for anxiety.   APPLE CIDER VINEGAR PO Take 1 capsule by mouth daily.   citalopram (CELEXA) 40 MG tablet Take 40 mg by mouth daily.   Dexlansoprazole 30 MG capsule DR Take 1 capsule by mouth daily.   famotidine (PEPCID) 40 MG tablet Take 40 mg by mouth daily. (Patient not taking: Reported on 03/17/2024)   magnesium oxide (MAG-OX) 400 (240 Mg) MG tablet Take 400 mg by mouth daily.   meloxicam (MOBIC) 7.5 MG tablet Take 7.5 mg by mouth daily.   Menthol, Topical Analgesic, (ICY HOT EX) Apply 1 Application topically daily as needed (pain).   Misc Natural Products (BLOOD SUGAR BALANCE PO) Take 2 tablets by mouth daily. Glucose tablets   OVER THE COUNTER MEDICATION Take 2 capsules by mouth daily. Super Fruits and Veggies   PRESCRIPTION MEDICATION Pt has CPAP machine   rosuvastatin (CRESTOR) 5 MG tablet Take 5 mg by mouth daily.   sildenafil (VIAGRA)  50 MG tablet Take 50-100 mg by mouth daily as needed for erectile dysfunction.   Testosterone  (ANDROGEL ) 20.25 MG/1.25GM (1.62%) GEL Place 1 Application onto the skin daily.   No current facility-administered medications for this visit. (Other)   REVIEW OF SYSTEMS:      ALLERGIES Allergies  Allergen Reactions   Erythromycin Other (See Comments)    Stomach pain    Penicillins Hives   Duloxetine     Other Reaction(s): Not available   Tamsulosin     Other Reaction(s): Stomach cramps   Tetracycline Hcl Other (See Comments)    Other Reaction(s): stomach upset   PAST MEDICAL HISTORY Past Medical History:  Diagnosis Date   Anxiety    Arthritis    Depression    Dry eye syndrome    post cataract surgery   Fibromyalgia    GERD (gastroesophageal reflux disease)    Headache    History of kidney stones    Pre-diabetes    Sleep apnea    Past Surgical History:  Procedure Laterality Date   CATARACT EXTRACTION Bilateral    CHOLECYSTECTOMY     COLONOSCOPY     HAMMER TOE SURGERY Right    INGUINAL HERNIA REPAIR     unsure which side   KNEE ARTHROSCOPY Right    PARS PLANA VITRECTOMY Right 12/06/2022   Procedure: PARS PLANA VITRECTOMY WITH 25 GAUGE;  Surgeon: Valdemar Rogue, MD;  Location: [facility] OR;  Service: Ophthalmology;  Laterality: Right;   PHOTOCOAGULATION WITH LASER Right 12/06/2022   Procedure: PHOTOCOAGULATION WITH LASER;  Surgeon: Valdemar Rogue, MD;  Location: [facility] OR;  Service: Ophthalmology;  Laterality: Right;   TONSILLECTOMY     FAMILY HISTORY No family history on file. SOCIAL HISTORY Social History   Tobacco Use   Smoking status: Never   Smokeless tobacco: Never  Vaping Use   Vaping status: Never Used  Substance Use Topics   Alcohol use: Not Currently   Drug use: Never      OPHTHALMIC EXAM:  Not recorded    IMAGING AND PROCEDURES  Imaging and Procedures for 06/02/2024         ASSESSMENT/PLAN:   ICD-10-CM   1. Posterior vitreous detachment of both  eyes  H43.813     2. Vitreous opacities of both eyes  H43.393     3. Pseudophakia, both eyes  Z96.1       **lost to retina f/u from 01.13.25 to 10.20.25**  1,2. PVD w/ visually significant vitreous opacities OU  - pt reports debilitating vitreous opacities x 5 years OU - pt reports intermittent periods with inability to read due to vitreous opacities / floaters OU - was going to schedule PPV w/ Dr. Jarold, but ran into insurance issues with the surgery center  - s/p PPV/EL OD, 08.15.2024 - doing well -- reports interval improvement in floaters OD, but floaters OS remain bothersome             - IOP okay at 19, 12 - OCT shows: OD Stable improvement in vitreous opacities (post PPV); OS: +vit opacities  - off all drops  - BCVA OD 20/20 from 20/15; OS 20/20  - pt wishes to proceed with PPV/EL OS on 12.04.25 - RBA of procedure discussed, questions answered - informed consent obtained and signed - case scheduled for Thursday, 12.04.25  - f/u 12.5.25 for POV1, DFE, OCT  3. Pseudophakia OU  - s/p CE/IOL OU (Dr. Meridee)  - IOLs in good position, doing well  - mild PCO OU  - monitor  Ophthalmic Meds Ordered this visit:  No orders of the defined types were placed in this encounter.    No follow-ups on file.  There are no Patient Instructions on file for this visit.  Explained the diagnoses, plan, and follow up with the patient and they expressed understanding.  Patient expressed understanding of the importance of proper follow up care.   This document serves as a record of services personally performed by Rogue JUDITHANN Valdemar, MD, PhD. It was created on their behalf by Wanda GEANNIE Keens, COT an ophthalmic technician. The creation of this record is the provider's dictation and/or activities during the visit.    Electronically signed by:  Wanda GEANNIE Keens, COT  05/28/24 7:36 AM  Rogue JUDITHANN Valdemar, M.D., Ph.D. Diseases & Surgery of the Retina and Vitreous [facility]  Abbreviations: M myopia (nearsighted); A astigmatism; H hyperopia (farsighted); P presbyopia; Mrx spectacle prescription;  CTL contact lenses; OD right eye; OS left eye; OU both eyes  XT exotropia; ET esotropia; PEK punctate epithelial keratitis; PEE punctate epithelial erosions; DES dry eye syndrome; MGD meibomian gland dysfunction; ATs artificial tears; PFAT's preservative free artificial tears; NSC nuclear sclerotic cataract; PSC posterior subcapsular cataract; ERM epi-retinal membrane; PVD posterior vitreous detachment; RD retinal detachment; DM diabetes mellitus; DR diabetic retinopathy; NPDR non-proliferative diabetic retinopathy; PDR proliferative diabetic retinopathy; CSME clinically significant macular edema; DME diabetic macular edema; dbh dot blot  hemorrhages; CWS cotton wool spot; POAG primary open angle glaucoma; C/D cup-to-disc ratio; HVF humphrey visual field; GVF goldmann visual field; OCT optical coherence tomography; IOP intraocular pressure; BRVO Branch retinal vein occlusion; CRVO central retinal vein occlusion; CRAO central retinal artery occlusion; BRAO branch retinal artery occlusion; RT retinal tear; SB scleral buckle; PPV pars plana vitrectomy; VH Vitreous hemorrhage; PRP panretinal laser photocoagulation; IVK intravitreal kenalog ; VMT vitreomacular traction; MH Macular hole;  NVD neovascularization of the disc; NVE neovascularization elsewhere; AREDS age related eye disease study; ARMD age related macular degeneration; POAG primary open angle glaucoma; EBMD epithelial/anterior basement membrane dystrophy; ACIOL anterior chamber intraocular lens; IOL intraocular lens; PCIOL posterior chamber intraocular lens; Phaco/IOL phacoemulsification with intraocular lens placement; PRK photorefractive keratectomy; LASIK laser assisted in situ keratomileusis; HTN hypertension; DM diabetes mellitus; COPD chronic obstructive pulmonary disease "

## 2024-06-02 ENCOUNTER — Encounter (INDEPENDENT_AMBULATORY_CARE_PROVIDER_SITE_OTHER): Admitting: Ophthalmology

## 2024-06-02 DIAGNOSIS — H43393 Other vitreous opacities, bilateral: Secondary | ICD-10-CM

## 2024-06-02 DIAGNOSIS — H43813 Vitreous degeneration, bilateral: Secondary | ICD-10-CM

## 2024-06-02 DIAGNOSIS — Z961 Presence of intraocular lens: Secondary | ICD-10-CM
